# Patient Record
Sex: Female | Born: 1970 | Race: Black or African American | Hispanic: No | Marital: Married | Smoking: Never smoker
Health system: Southern US, Community
[De-identification: ages and names within clinical notes are randomized; demographics above are authoritative.]

## PROBLEM LIST (undated history)

## (undated) DIAGNOSIS — I10 Essential (primary) hypertension: Secondary | ICD-10-CM

## (undated) HISTORY — PX: BREAST BIOPSY: SHX20

---

## 2009-04-23 HISTORY — PX: BREAST BIOPSY: SHX20

## 2015-04-24 HISTORY — PX: COLONOSCOPY: SHX174

## 2017-06-16 ENCOUNTER — Emergency Department (HOSPITAL_COMMUNITY)
Admission: EM | Admit: 2017-06-16 | Discharge: 2017-06-16 | Disposition: A | Discharge status: Home / Self Care | Payer: 59 | Attending: Emergency Medicine | Admitting: Emergency Medicine

## 2017-06-16 ENCOUNTER — Encounter (HOSPITAL_COMMUNITY): Payer: Self-pay

## 2017-06-16 ENCOUNTER — Emergency Department (HOSPITAL_COMMUNITY): Payer: 59

## 2017-06-16 DIAGNOSIS — J111 Influenza due to unidentified influenza virus with other respiratory manifestations: Secondary | ICD-10-CM | POA: Diagnosis not present

## 2017-06-16 DIAGNOSIS — I10 Essential (primary) hypertension: Secondary | ICD-10-CM | POA: Diagnosis not present

## 2017-06-16 DIAGNOSIS — R05 Cough: Secondary | ICD-10-CM | POA: Diagnosis present

## 2017-06-16 HISTORY — DX: Essential (primary) hypertension: I10

## 2017-06-16 LAB — CBC
HCT: 34.9 % — ABNORMAL LOW (ref 36.0–46.0)
HEMOGLOBIN: 11.4 g/dL — AB (ref 12.0–15.0)
MCH: 27.4 pg (ref 26.0–34.0)
MCHC: 32.7 g/dL (ref 30.0–36.0)
MCV: 83.9 fL (ref 78.0–100.0)
PLATELETS: 391 10*3/uL (ref 150–400)
RBC: 4.16 MIL/uL (ref 3.87–5.11)
RDW: 15.5 % (ref 11.5–15.5)
WBC: 10.4 10*3/uL (ref 4.0–10.5)

## 2017-06-16 LAB — I-STAT CG4 LACTIC ACID, ED: LACTIC ACID, VENOUS: 1.48 mmol/L (ref 0.5–1.9)

## 2017-06-16 LAB — BASIC METABOLIC PANEL
Anion gap: 12 (ref 5–15)
BUN: 13 mg/dL (ref 6–20)
CHLORIDE: 103 mmol/L (ref 101–111)
CO2: 22 mmol/L (ref 22–32)
Calcium: 8.7 mg/dL — ABNORMAL LOW (ref 8.9–10.3)
Creatinine, Ser: 0.94 mg/dL (ref 0.44–1.00)
Glucose, Bld: 106 mg/dL — ABNORMAL HIGH (ref 65–99)
POTASSIUM: 3.1 mmol/L — AB (ref 3.5–5.1)
SODIUM: 137 mmol/L (ref 135–145)

## 2017-06-16 IMAGING — CR DG CHEST 2V
2 series · 2 of 2 positions shown · non-contrast
Comparison: None.

CLINICAL DATA: Shortness of Breath

EXAM:
CHEST  2 VIEW

[chest pa]
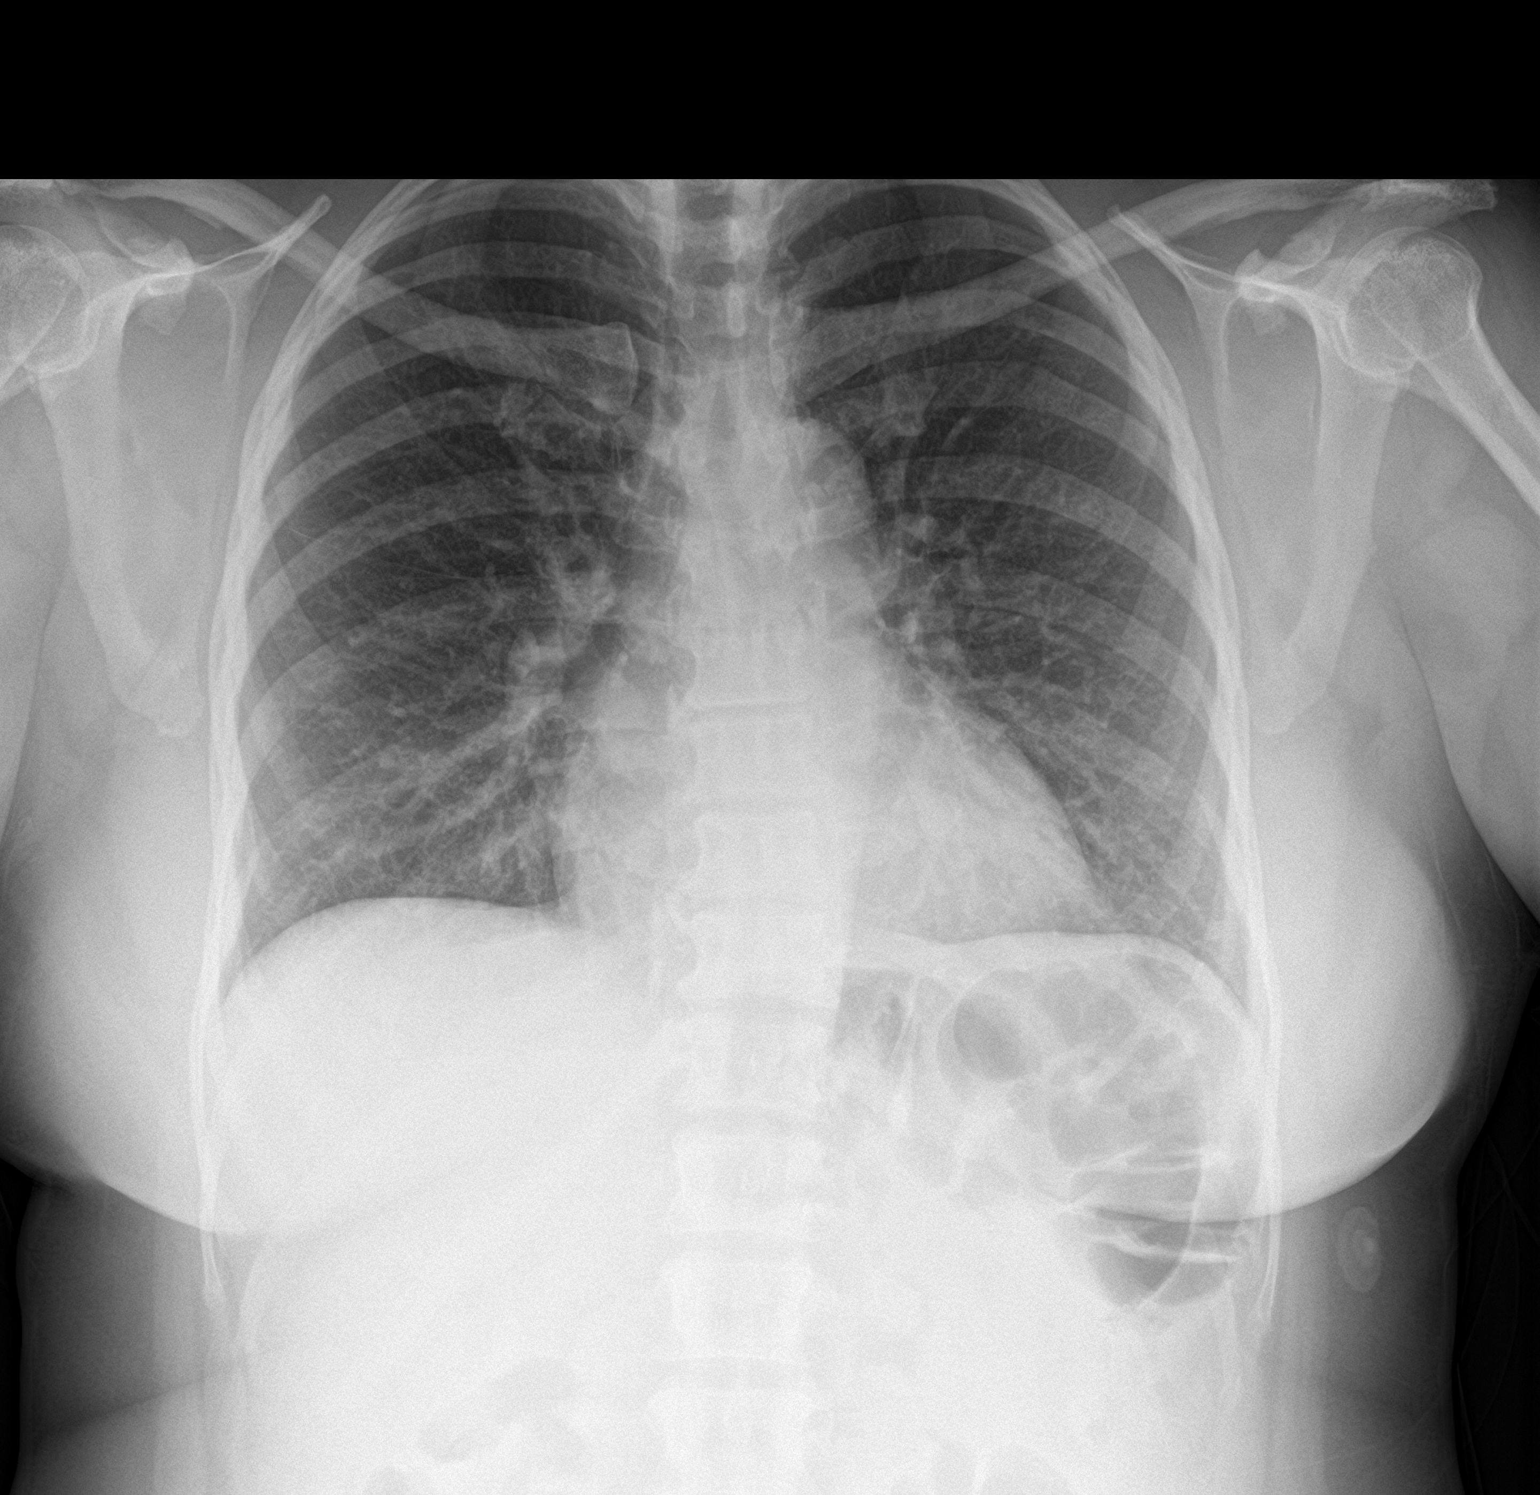

[chest lat]
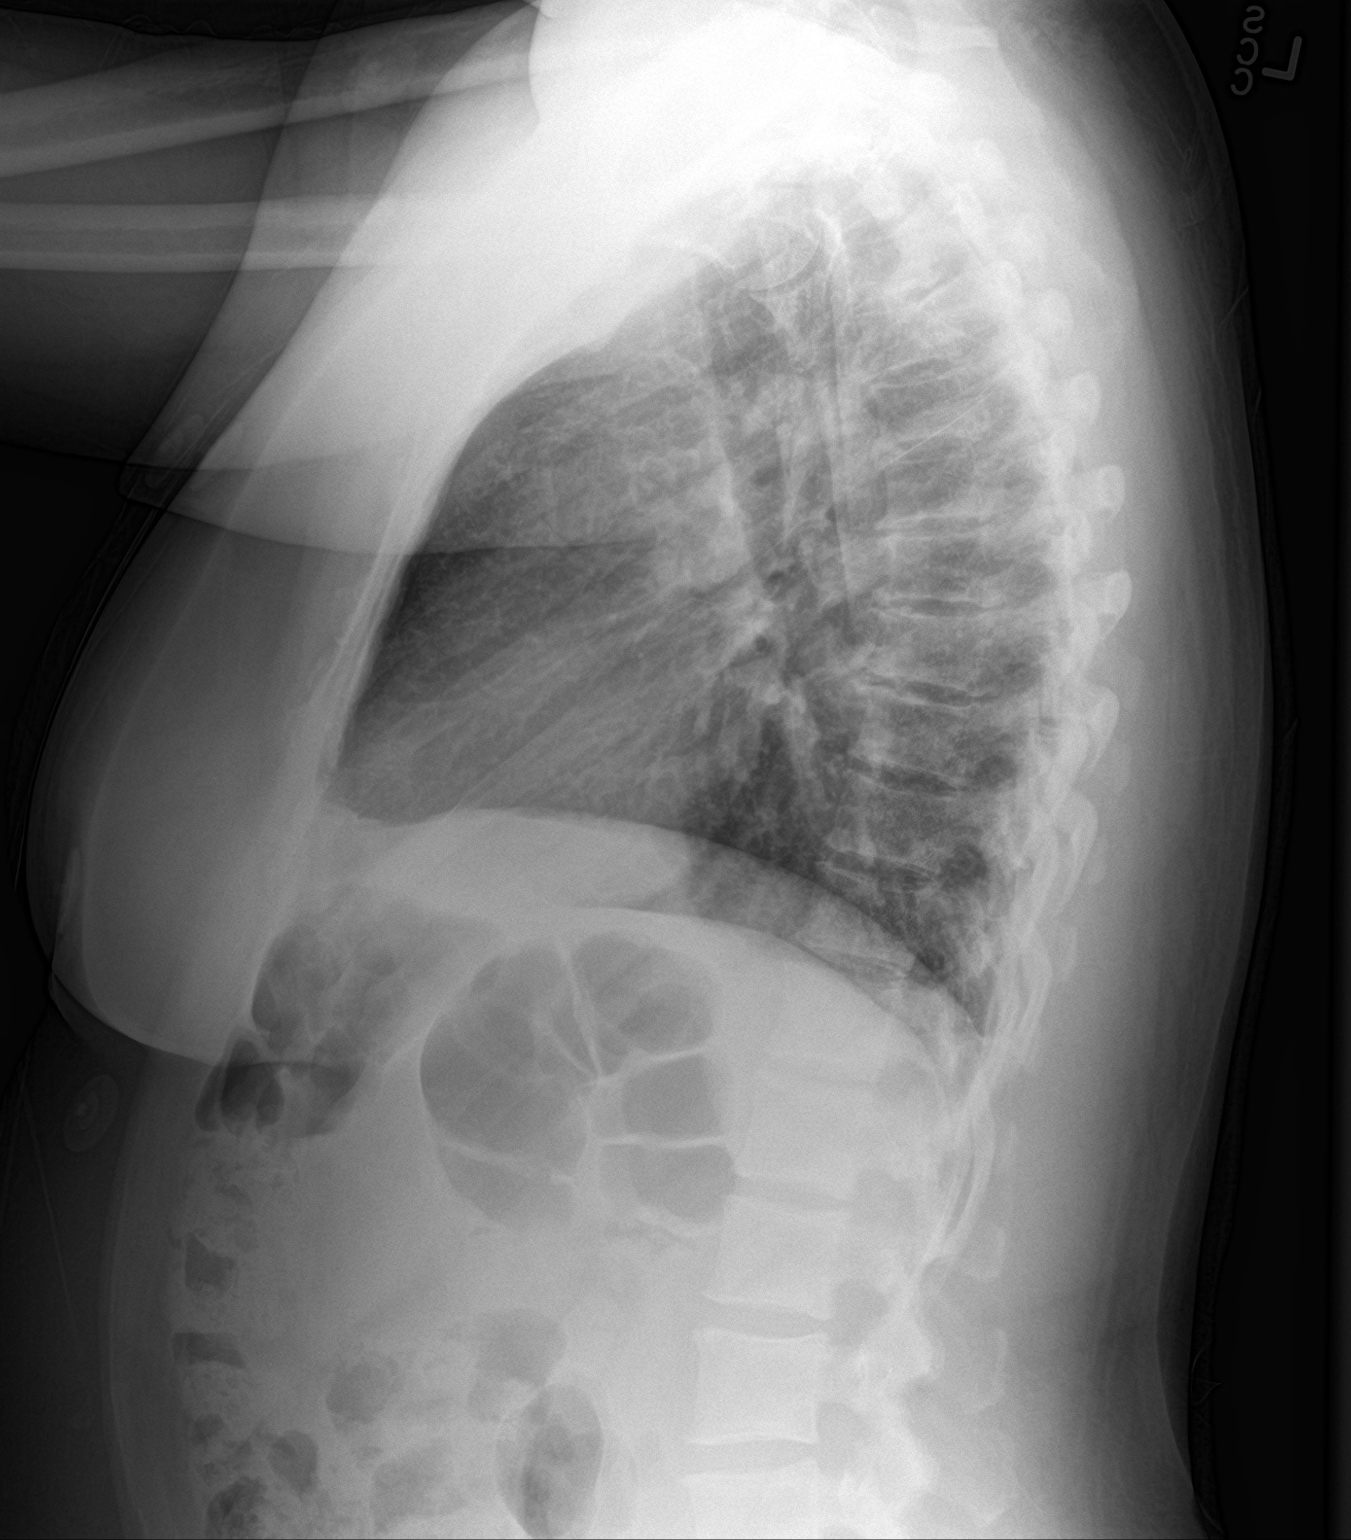

[2 of 2 positions shown; findings below may reference images not displayed]

FINDINGS: Mild peribronchial thickening. Heart and mediastinal contours are
within normal limits. No focal opacities or effusions. No acute bony
abnormality.
IMPRESSION: Mild bronchitic changes.

## 2017-06-16 MED ORDER — SODIUM CHLORIDE 0.9 % IV BOLUS (SEPSIS)
1000.0000 mL | Freq: Once | INTRAVENOUS | Status: AC
  Administered 2017-06-16: 1000 mL via INTRAVENOUS

## 2017-06-16 MED ORDER — OSELTAMIVIR PHOSPHATE 75 MG PO CAPS: 75.0000 mg | ORAL_CAPSULE | Freq: Two times a day (BID) | ORAL | 0 refills | Status: DC

## 2017-06-16 MED ORDER — IBUPROFEN 400 MG PO TABS
600.0000 mg | ORAL_TABLET | Freq: Once | ORAL | Status: AC
  Administered 2017-06-16: 600 mg via ORAL
  Filled 2017-06-16: qty 1

## 2017-06-16 MED ORDER — IBUPROFEN 600 MG PO TABS: 600.0000 mg | ORAL_TABLET | Freq: Four times a day (QID) | ORAL | 0 refills | Status: DC | PRN

## 2017-06-16 MED ORDER — OSELTAMIVIR PHOSPHATE 75 MG PO CAPS
75.0000 mg | ORAL_CAPSULE | Freq: Two times a day (BID) | ORAL | Status: DC
  Administered 2017-06-16: 75 mg via ORAL
  Filled 2017-06-16: qty 1

## 2017-06-16 NOTE — ED Notes (Addendum)
D/c reviewed with patient and spouse. Encouraged to complete all of the TAMIFLU ordered

## 2017-06-16 NOTE — ED Provider Notes (Signed)
[facility] Provider Note   CSN: [id number] Arrival date & time: 06/16/17  1926     History   Chief Complaint Chief Complaint  Patient presents with  . Shortness of Breath    HPI Mckenzie Jennings is a 47 y.o. female.  HPI Patient states she started having trouble with cough and congestion yesterday.  She has been bringing up yellow sputum.  She started having some shortness of breath last night as well as body aches.  She went to an urgent care and was diagnosed with influenza.  The doctor was  also concerned about the possibility of pneumonia so he gave her a shot of Rocephin and sent her to the ED.  Patient denies any vomiting or diarrhea.  No neck pain or stiffness. Past Medical History:  Diagnosis Date  . Hypertension     There are no active problems to display for this patient.   Past Surgical History:  Procedure Laterality Date  . BREAST BIOPSY      OB History    No data available       Home Medications    Prior to Admission medications   Medication Sig Start Date End Date Taking? Authorizing Provider  ibuprofen (ADVIL,MOTRIN) 600 MG tablet Take 1 tablet (600 mg total) by mouth every 6 (six) hours as needed. 06/16/17   Dorie Rank, MD  oseltamivir (TAMIFLU) 75 MG capsule Take 1 capsule (75 mg total) by mouth 2 (two) times daily. 06/16/17   Dorie Rank, MD    Family History No family history on file.  Social History Social History   Tobacco Use  . Smoking status: Never Smoker  . Smokeless tobacco: Never Used  Substance Use Topics  . Alcohol use: Yes    Alcohol/week: 0.6 oz    Types: 1 Glasses of wine per week  . Drug use: No     Allergies   Patient has no known allergies.   Review of Systems Review of Systems  All other systems reviewed and are negative.    Physical Exam Updated Vital Signs BP 132/72   Pulse (!) 119   Temp (!) 100.9 F (38.3 C) (Oral)   Resp 20   Ht 1.651 m (5\' 5" )   Wt 83.9 kg  (185 lb)   SpO2 99%   BMI 30.79 kg/m   Physical Exam  Constitutional: She appears well-developed and well-nourished. No distress.  HENT:  Head: Normocephalic and atraumatic.  Right Ear: External ear normal.  Left Ear: External ear normal.  Eyes: Conjunctivae are normal. Right eye exhibits no discharge. Left eye exhibits no discharge. No scleral icterus.  Neck: Neck supple. No tracheal deviation present.  Cardiovascular: Regular rhythm and intact distal pulses. Tachycardia present.  Pulmonary/Chest: Effort normal and breath sounds normal. No stridor. No respiratory distress. She has no wheezes. She has no rales.  Abdominal: Soft. Bowel sounds are normal. She exhibits no distension. There is no tenderness. There is no rebound and no guarding.  Musculoskeletal: She exhibits no edema or tenderness.  Neurological: She is alert. She has normal strength. No cranial nerve deficit (no facial droop, extraocular movements intact, no slurred speech) or sensory deficit. She exhibits normal muscle tone. She displays no seizure activity. Coordination normal.  Skin: Skin is warm and dry. No rash noted.  Psychiatric: She has a normal mood and affect.  Nursing note and vitals reviewed.    ED Treatments / Results  Labs (all labs ordered are listed, but only  abnormal results are displayed) Labs Reviewed  CBC - Abnormal; Notable for the following components:      Result Value   Hemoglobin 11.4 (*)    HCT 34.9 (*)    All other components within normal limits  BASIC METABOLIC PANEL - Abnormal; Notable for the following components:   Potassium 3.1 (*)    Glucose, Bld 106 (*)    Calcium 8.7 (*)    All other components within normal limits  I-STAT CG4 LACTIC ACID, ED    EKG  EKG Interpretation  Date/Time:  "Sunday June 16 2017 19:35:53 EST Ventricular Rate:  124 PR Interval:  142 QRS Duration: 88 QT Interval:  322 QTC Calculation: 462 R Axis:   72 Text Interpretation:  Sinus tachycardia  Right atrial enlargement Possible Inferior infarct , age undetermined Abnormal ECG No previous tracing Confirmed by Onofrio Klemp ([id number]) on 06/16/2017 8:48:59 PM       Radiology Dg Chest 2 View  Result Date: 06/16/2017 CLINICAL DATA:  Shortness of Breath EXAM: CHEST  2 VIEW COMPARISON:  None. FINDINGS: Mild peribronchial thickening. Heart and mediastinal contours are within normal limits. No focal opacities or effusions. No acute bony abnormality. IMPRESSION: Mild bronchitic changes. Electronically Signed   By: Kevin  Dover M.D.   On: 06/16/2017 20:05    Procedures Procedures (including critical care time)  Medications Ordered in ED Medications  sodium chloride 0.9 % bolus 1,000 mL   oseltamivir (TAMIFLU) capsule 75 mg  ibuprofen (ADVIL,MOTRIN) tablet 600 mg     Initial Impression / Assessment and Plan / ED Course  I have reviewed the triage vital signs and the nursing notes.  Pertinent labs & imaging results that were available during my care of the patient were reviewed by me and considered in my medical decision making (see chart for details).   Patient's laboratory tests are reassuring.  Mild hypokalemia and anemia noted but I doubt this is clinically significant.  Lactic acid is normal.  Chest x-ray without pneumonia.  No signs of sepsis.  Patient is tachycardic but I suspect this is related to the influenza and her fever.  We will give the patient a bolus of IV fluids and a dose of Tamiflu.  Plan on discharge home with a prescription for Tamiflu.  Final Clinical Impressions(s) / ED Diagnoses   Final diagnoses:  Influenza    ED Discharge Orders        Ordered    oseltamivir (TAMIFLU) 75 MG capsule  2 times daily     06/16/17 2112    ibuprofen (ADVIL,MOTRIN) 600 MG tablet  Every 6 hours PRN     02" /24/19 2112       Dorie Rank, MD 06/16/17 2112

## 2017-06-16 NOTE — ED Triage Notes (Signed)
Pt comes from [facility] for flu and pneumonia. Some SOB since last night. Productive cough with yellow sputum

## 2017-06-16 NOTE — Discharge Instructions (Signed)
Follow-up with your primary care doctor if you are not improving in the next week, drink plenty of fluids and rest, take ibuprofen or Tylenol as needed for aches, pains and fevers

## 2017-12-02 ENCOUNTER — Other Ambulatory Visit: Payer: Self-pay | Admitting: Family Medicine

## 2017-12-02 DIAGNOSIS — N6310 Unspecified lump in the right breast, unspecified quadrant: Secondary | ICD-10-CM

## 2017-12-20 ENCOUNTER — Other Ambulatory Visit: Payer: 59

## 2017-12-24 ENCOUNTER — Ambulatory Visit
Admission: RE | Admit: 2017-12-24 | Discharge: 2017-12-24 | Disposition: A | Discharge status: Home / Self Care | Payer: Self-pay | Source: Ambulatory Visit | Attending: Family Medicine | Admitting: Family Medicine

## 2017-12-24 ENCOUNTER — Other Ambulatory Visit: Payer: Self-pay | Admitting: Family Medicine

## 2017-12-24 ENCOUNTER — Ambulatory Visit
Admission: RE | Admit: 2017-12-24 | Discharge: 2017-12-24 | Disposition: A | Discharge status: Home / Self Care | Payer: PRIVATE HEALTH INSURANCE | Source: Ambulatory Visit | Attending: Family Medicine | Admitting: Family Medicine

## 2017-12-24 DIAGNOSIS — N6310 Unspecified lump in the right breast, unspecified quadrant: Secondary | ICD-10-CM

## 2017-12-24 IMAGING — MG DIGITAL DIAGNOSTIC BILATERAL MAMMOGRAM WITH TOMO AND CAD
8 series · 8 of 24 positions shown · non-contrast
Comparison: Previous exam(s).

CLINICAL DATA: 47-year-old female with right breast pain which has
since resolved.

EXAM:
DIGITAL DIAGNOSTIC BILATERAL MAMMOGRAM WITH CAD AND TOMO
RIGHT BREAST ULTRASOUND

[R MLO synth-2D]
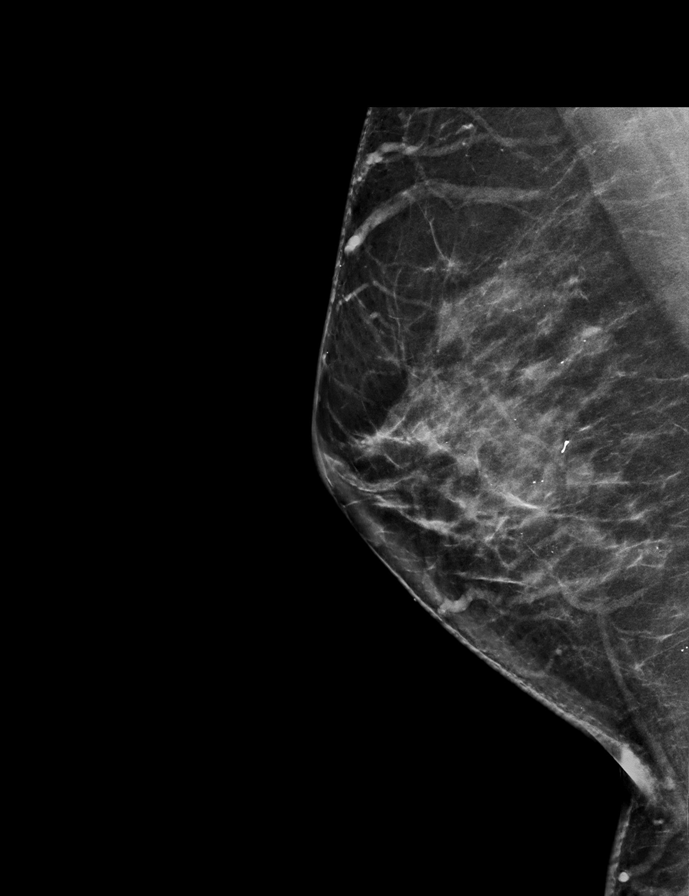

[L CC synth-2D]
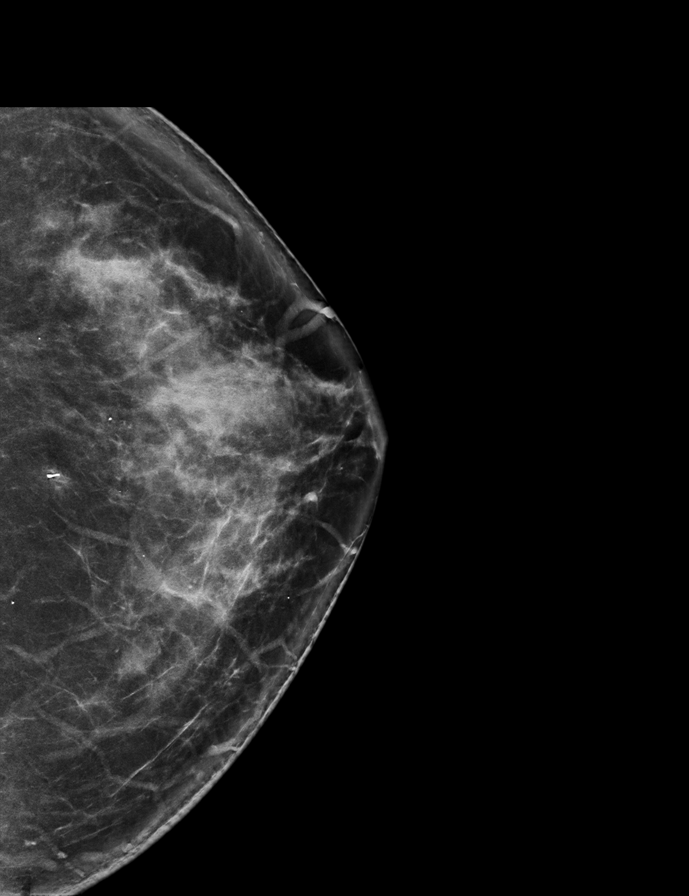

[L MLO synth-2D]
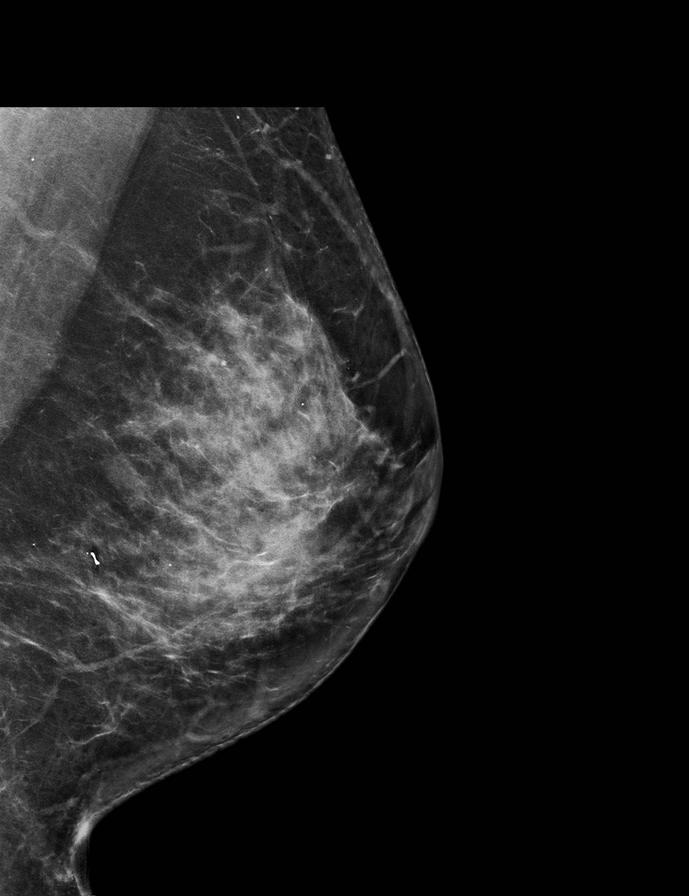

[R CC synth-2D]
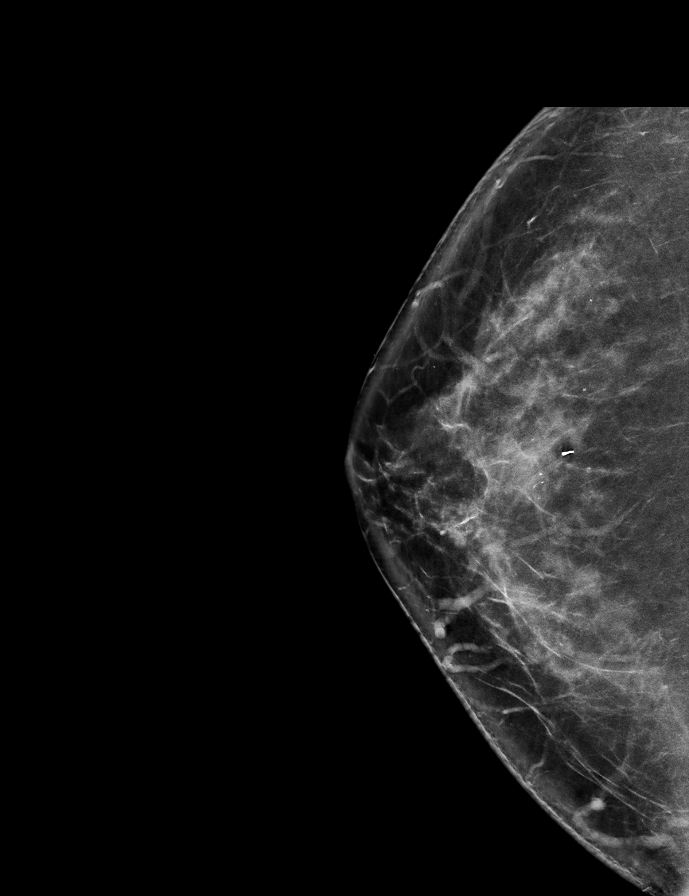

[L CC tomo · tomo slice 43/85.0]
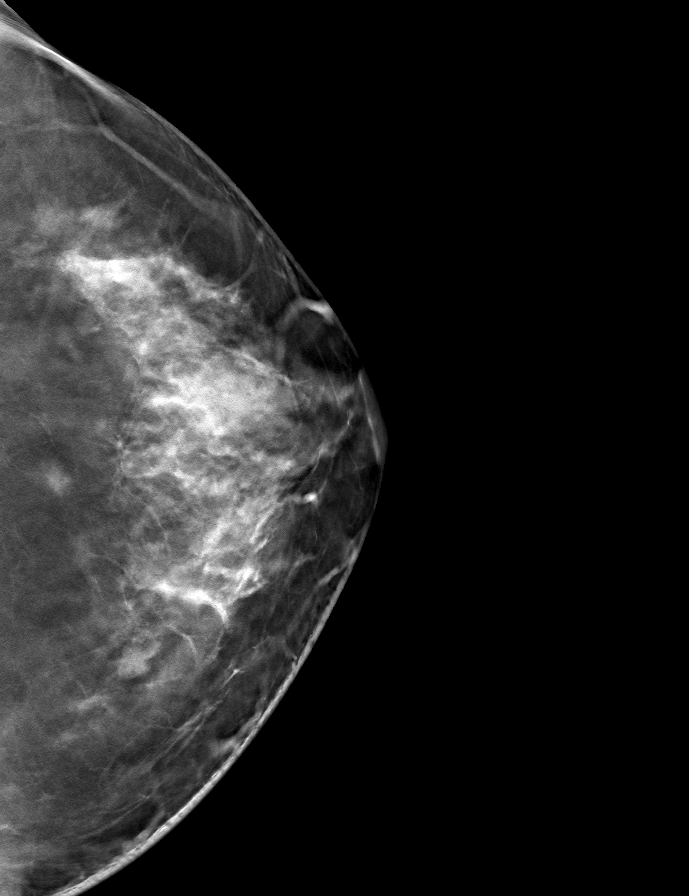

[R CC tomo · tomo slice 43/85.0]
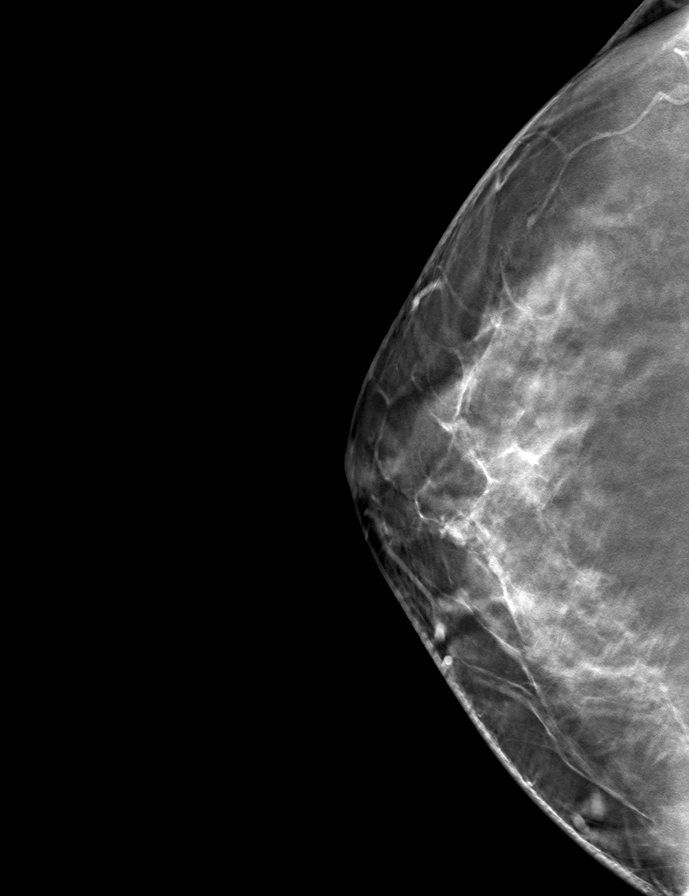

[R MLO tomo · tomo slice 41/82.0]
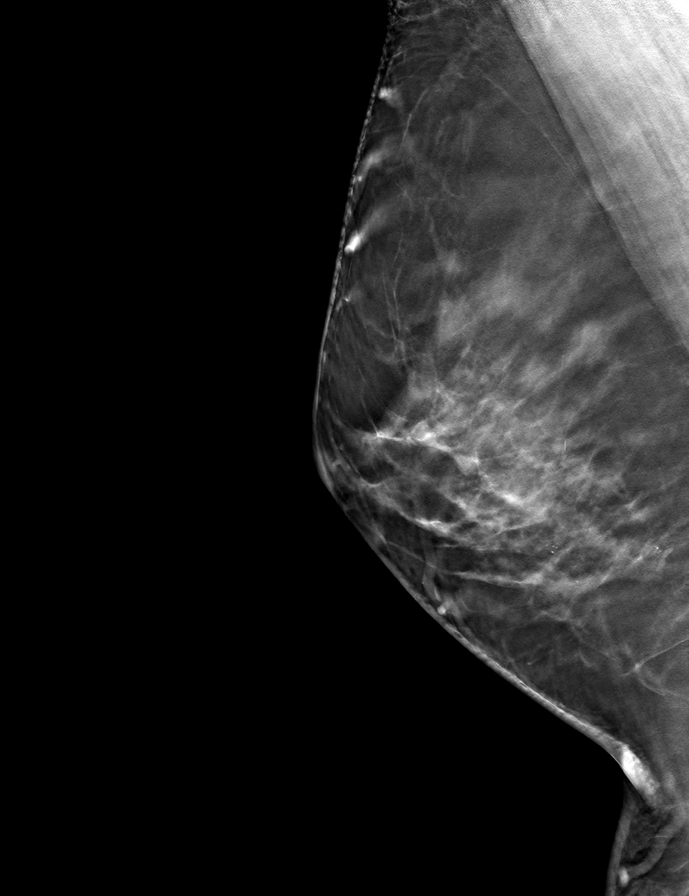

[L MLO tomo · tomo slice 43/86.0]
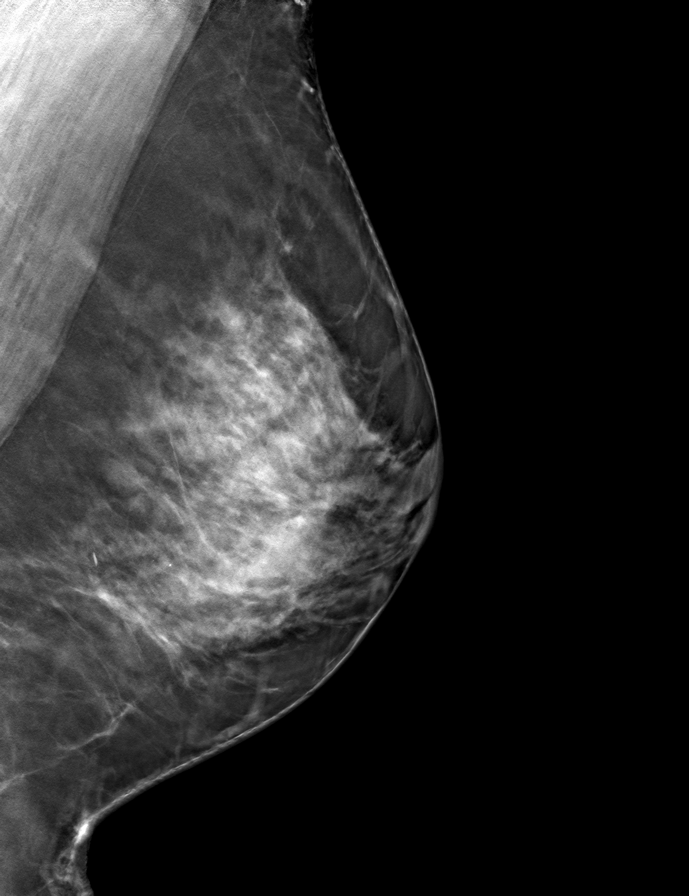

[8 of 24 positions shown; findings below may reference images not displayed]

ACR Breast Density Category c: The breast tissue is heterogeneously
dense, which may obscure small masses.
FINDINGS: There is an oval circumscribed mass in the upper slightly outer
right breast measuring 0.8 cm. No suspicious masses or abnormality
seen in the left breast. Several additional bilateral circumscribed
oval and round circumscribed masses are present suggestive of
fluctuating cysts or fibroadenomas.

Mammographic images were processed with CAD.

Physical examination the upper outer right breast does not reveal
any palpable masses.

Targeted ultrasound of the right breast was performed demonstrating
a cyst at the [DATE] position 4 cm from nipple measuring 0.6 x 0.3 x
0.6 cm. An additional probable complicated cyst or cluster of
microcysts at 12 o'clock 2 cm from nipple measures 0.5 x 0.2 x
cm. An additional probable cluster of cysts at the 2 o'clock
position 2 cm from nipple measures 0.6 x 0.4 x 0.9 cm. Additional
scattered smaller cysts are seen in the right breast. No suspicious
masses or abnormality seen.
IMPRESSION: Probably benign masses in the right breast at the 12 o'clock
position and 2 o'clock position.

RECOMMENDATION:
Diagnostic mammography of the right breast in 6 months with right
breast ultrasound.

I have discussed the findings and recommendations with the patient.
Results were also provided in writing at the conclusion of the
visit. If applicable, a reminder letter will be sent to the patient
regarding the next appointment.

BI-RADS CATEGORY  3: Probably benign.

## 2018-06-25 ENCOUNTER — Other Ambulatory Visit: Payer: Self-pay | Admitting: Family Medicine

## 2018-06-25 ENCOUNTER — Ambulatory Visit
Admission: RE | Admit: 2018-06-25 | Discharge: 2018-06-25 | Disposition: A | Discharge status: Home / Self Care | Payer: PRIVATE HEALTH INSURANCE | Source: Ambulatory Visit | Attending: Family Medicine | Admitting: Family Medicine

## 2018-06-25 DIAGNOSIS — N6310 Unspecified lump in the right breast, unspecified quadrant: Secondary | ICD-10-CM

## 2018-06-25 IMAGING — MG DIGITAL DIAGNOSTIC UNILATERAL RIGHT MAMMOGRAM WITH TOMO AND CAD
4 series · 4 of 12 positions shown · non-contrast
Comparison: Previous exam(s).

CLINICAL DATA: Short-term follow-up for 2 probably benign masses in
the right breast, initially evaluated in [DATE].

EXAM:
DIGITAL DIAGNOSTIC RIGHT MAMMOGRAM WITH CAD AND TOMO
ULTRASOUND RIGHT BREAST

[R CC synth-2D]
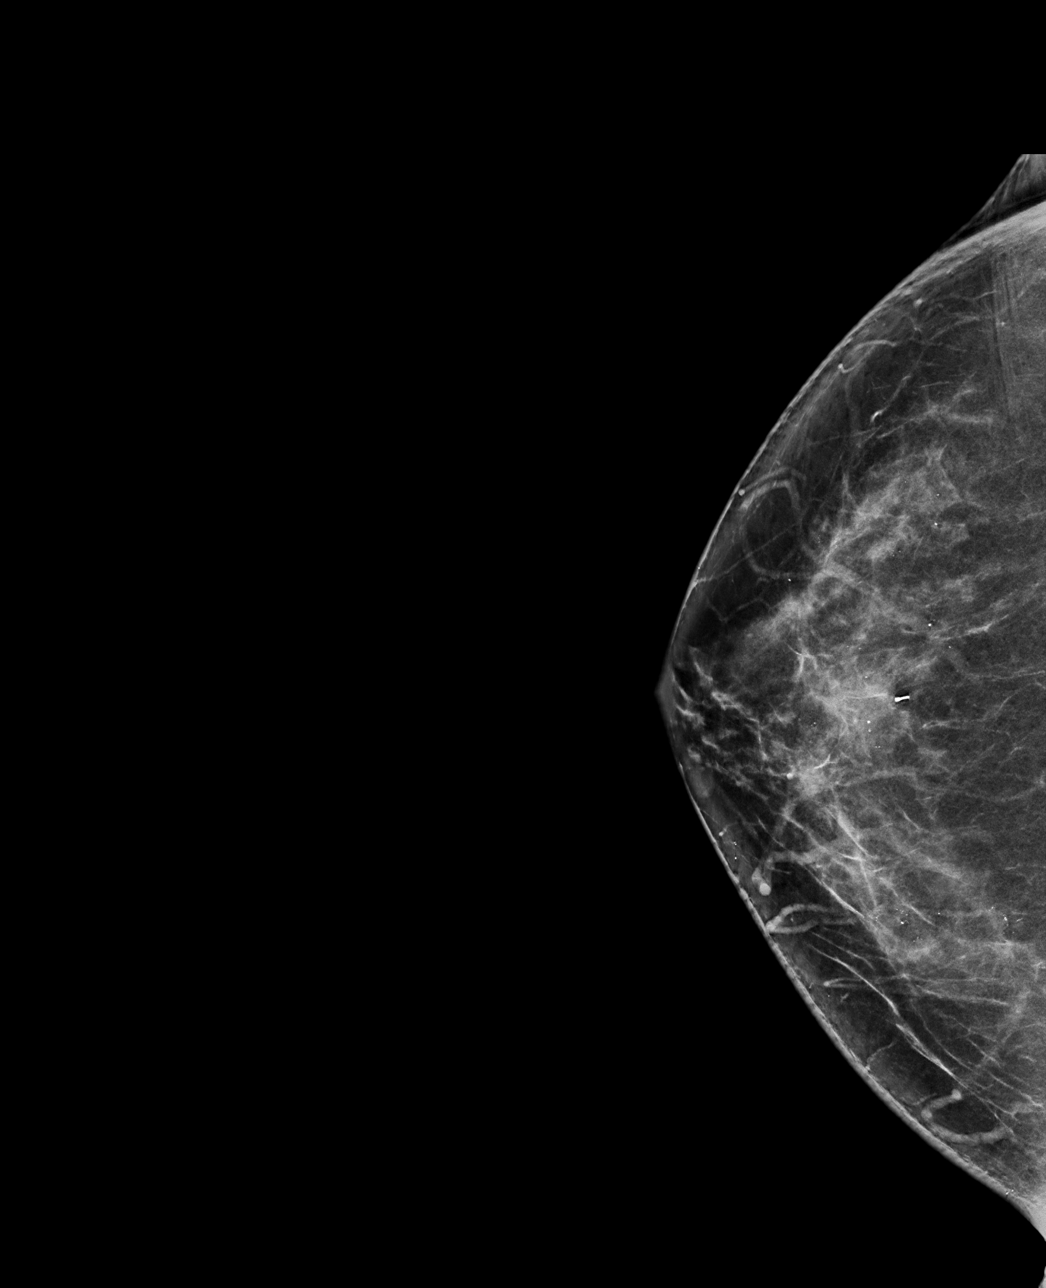

[R MLO synth-2D]
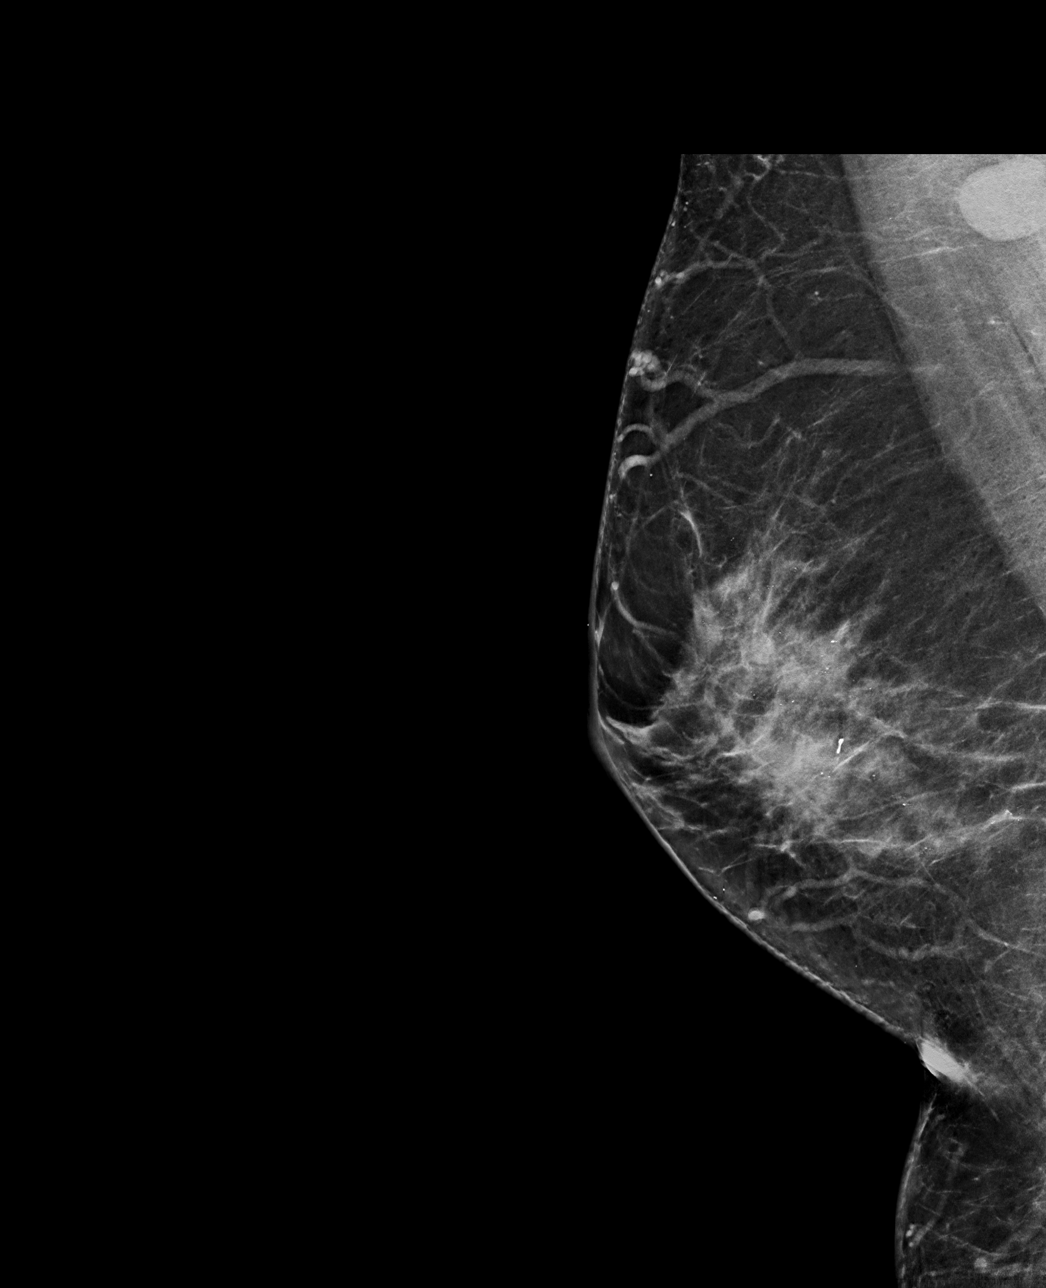

[R MLO tomo · tomo slice 43/84.0]
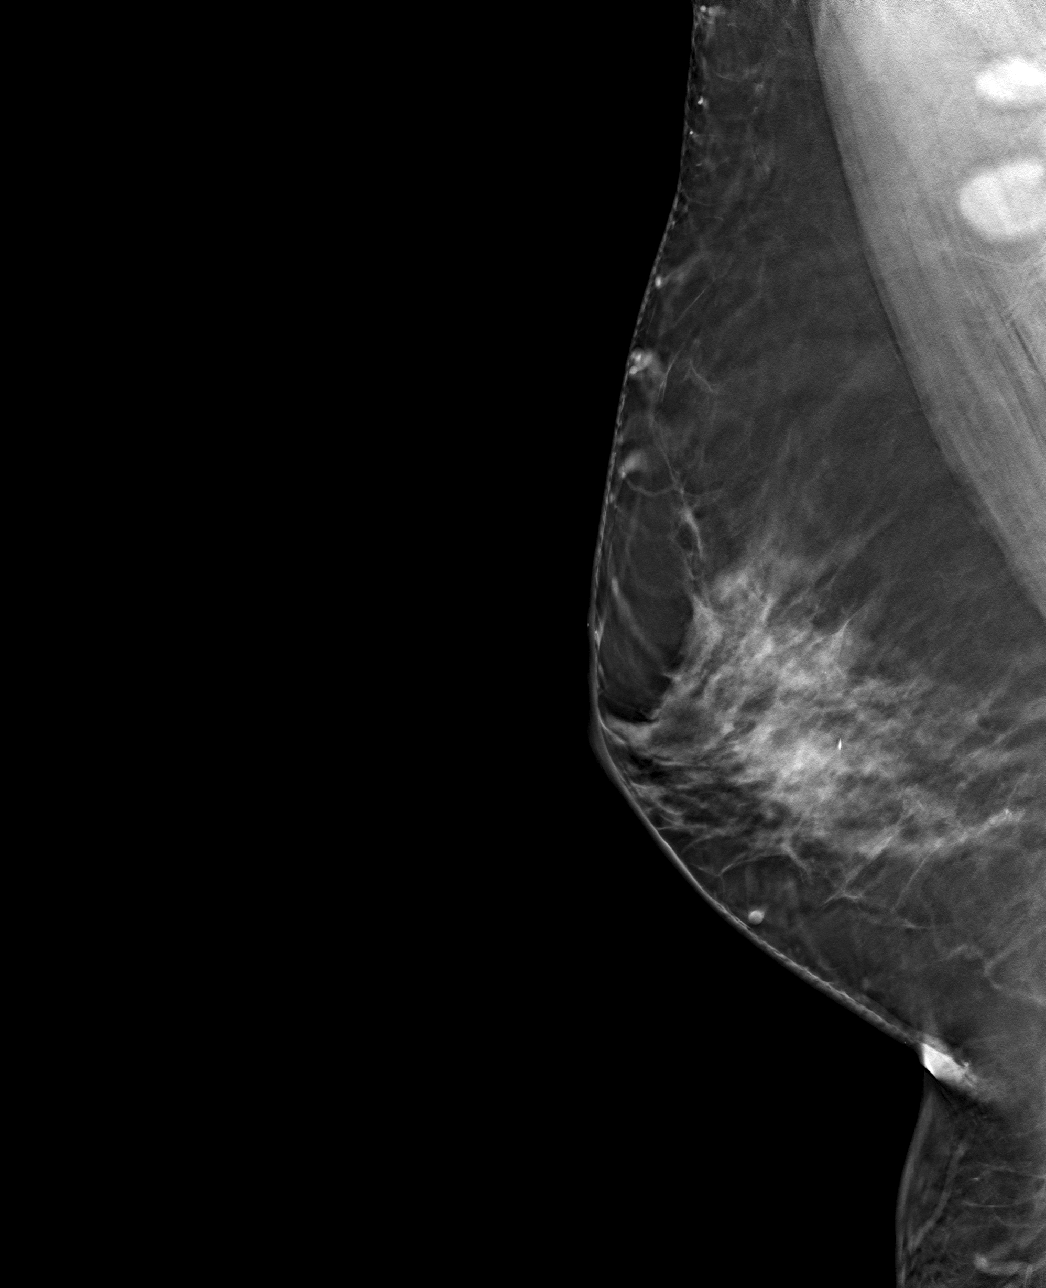

[R CC tomo · tomo slice 41/81.0]
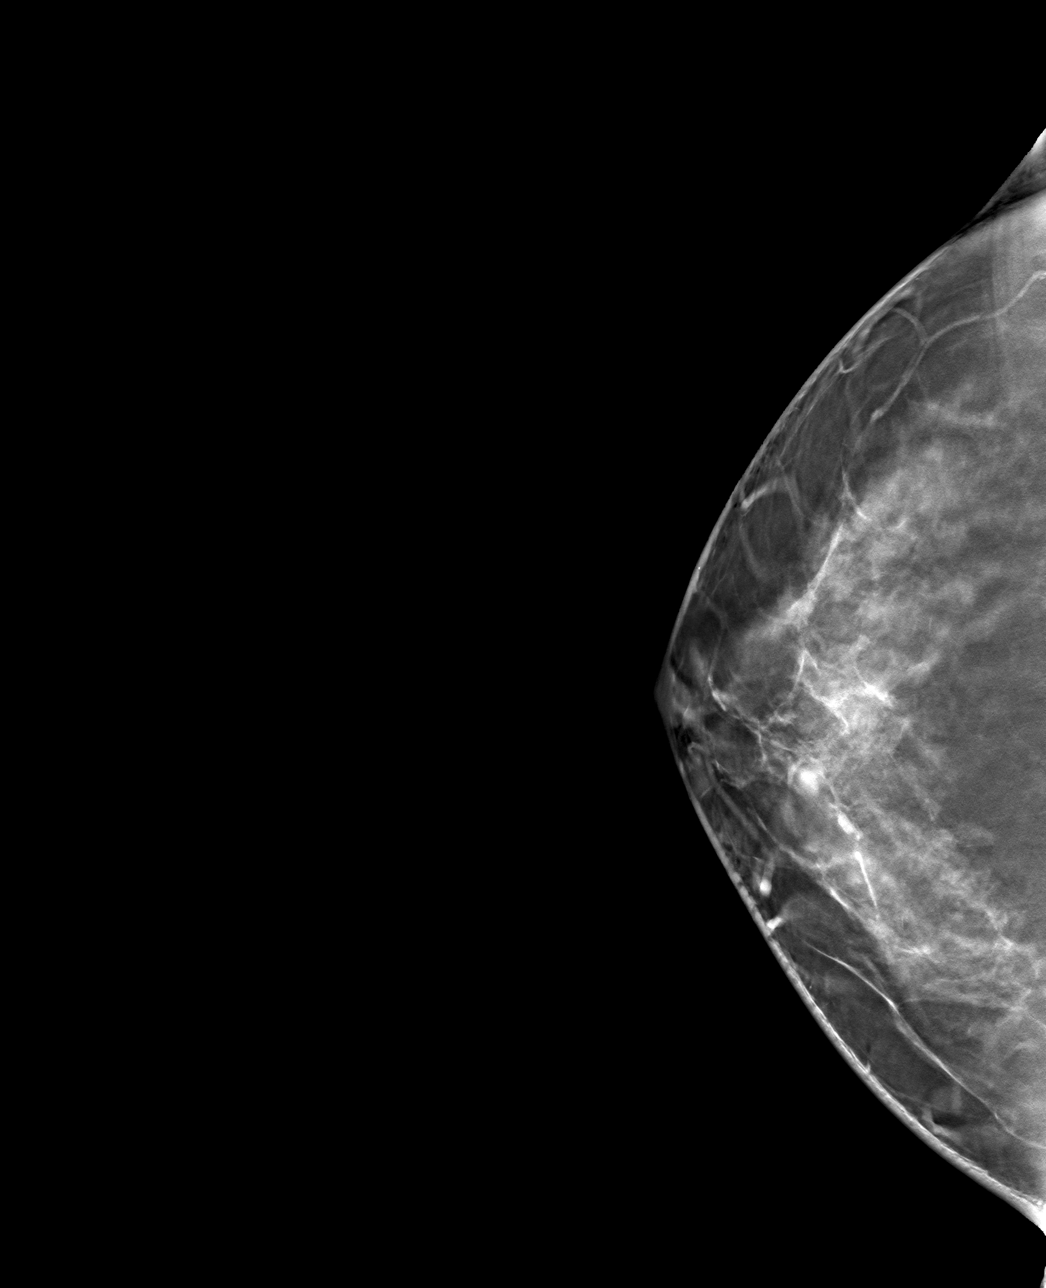

[4 of 12 positions shown; findings below may reference images not displayed]

ACR Breast Density Category c: The breast tissue is heterogeneously
dense, which may obscure small masses.
FINDINGS: The subtle mass noted in the upper outer right breast, along the
posterior margin of the fibroglandular tissue, is without
significant change. There are no new masses, no areas of
architectural distortion and no new or suspicious calcifications.

Mammographic images were processed with CAD.

Targeted ultrasound is performed, showing oval mass in the right
breast at 2 o'clock, 2 cm the nipple, measuring 9 x 4 x 5 mm. It
contains a thin septation in a few internal echoes. At 12 o'clock, 2
cm the nipple, there is a smaller hypo to anechoic mass with a few
internal echoes measuring 6 x 3 x 5 mm. These masses, consistent
with complicated cysts, are unchanged from prior exam.
IMPRESSION: 1. Probably benign masses in the right breast, most likely
complicated cysts, stable for 6 months. Additional short-term
follow-up recommended.
2. No new abnormalities.

RECOMMENDATION:
Diagnostic mammography and right breast ultrasound in 6 months.

I have discussed the findings and recommendations with the patient.
Results were also provided in writing at the conclusion of the
visit. If applicable, a reminder letter will be sent to the patient
regarding the next appointment.

BI-RADS CATEGORY  3: Probably benign.

## 2018-06-25 IMAGING — US ULTRASOUND RIGHT BREAST LIMITED
1 series · 8 of 8 positions shown · non-contrast
Comparison: Previous exam(s).

CLINICAL DATA: Short-term follow-up for 2 probably benign masses in
the right breast, initially evaluated in [DATE].

EXAM:
DIGITAL DIAGNOSTIC RIGHT MAMMOGRAM WITH CAD AND TOMO
ULTRASOUND RIGHT BREAST

[Series 1: ultrasound right breast limited · 0.06mm/px · 8 of 8 slices shown]
[im 1/8]
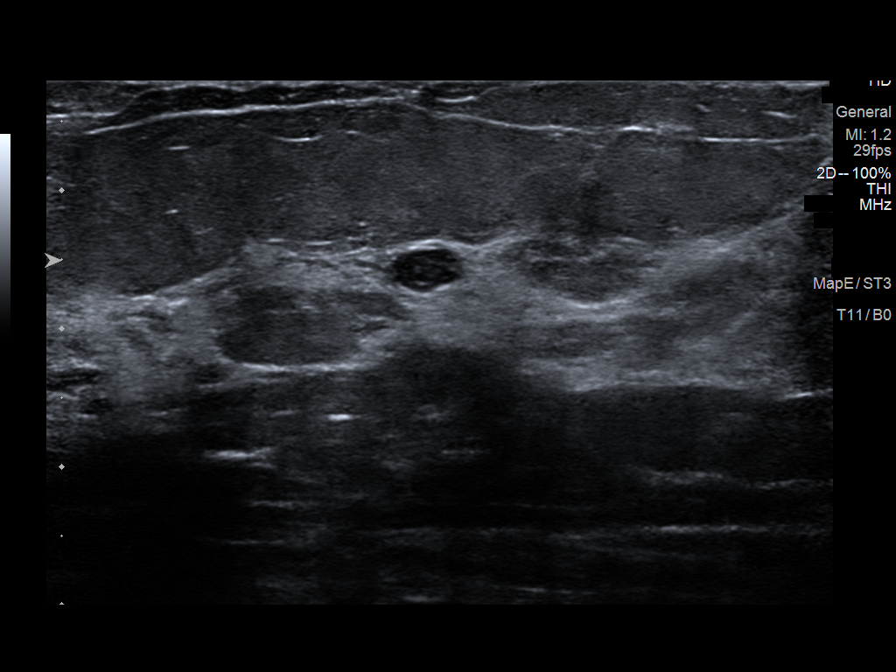
[im 2/8]
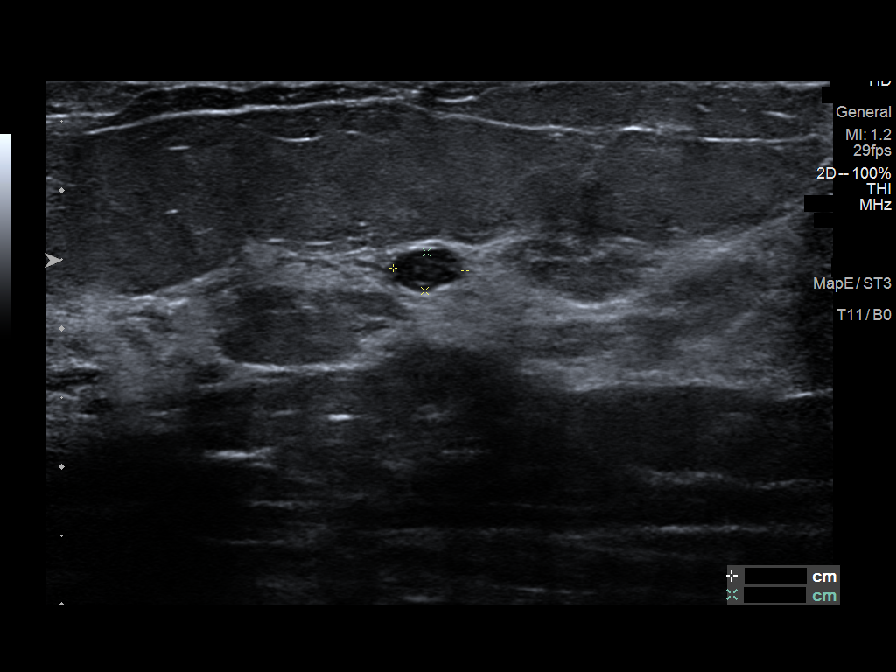
[im 3/8]
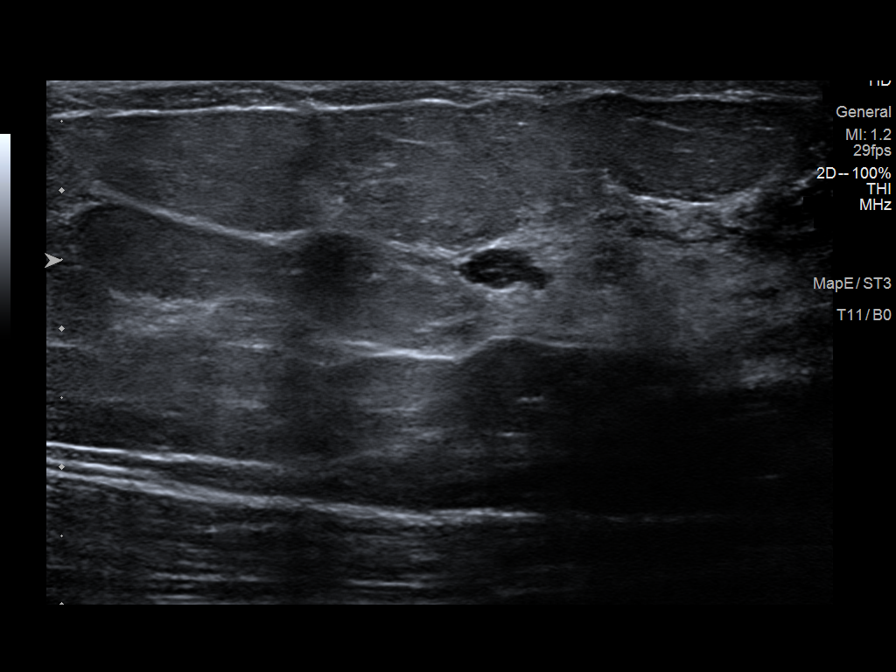
[im 4/8]
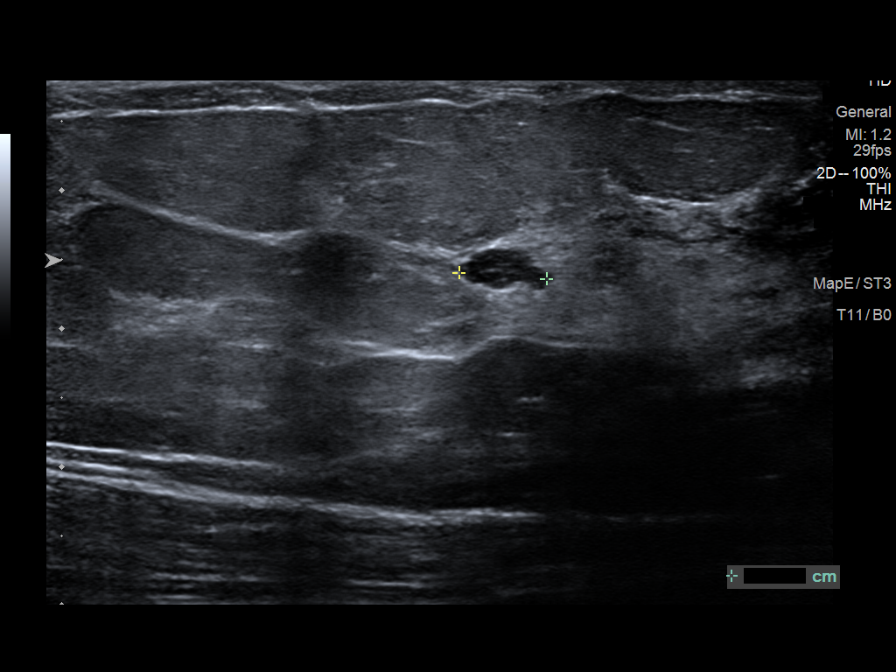
[im 5/8]
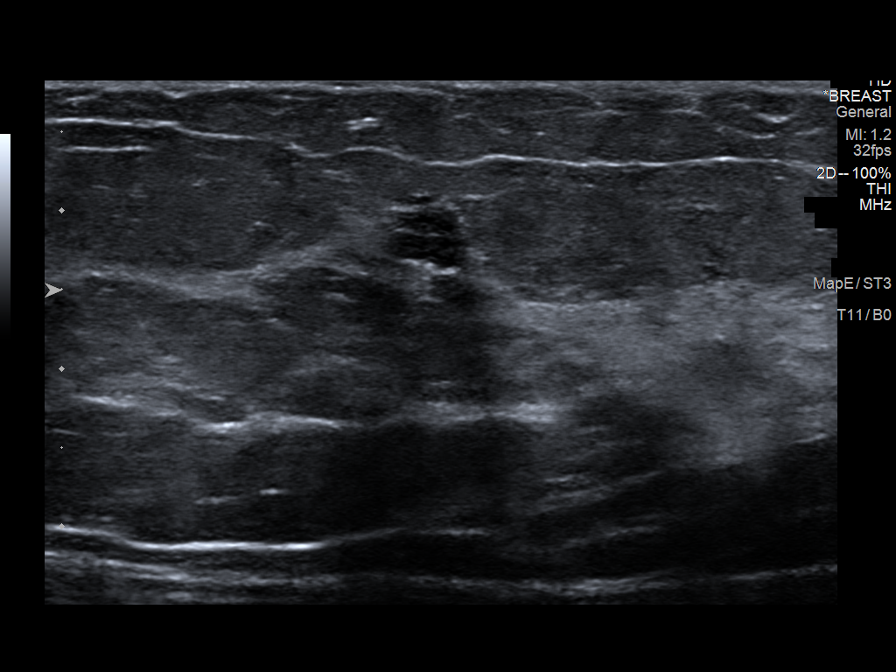
[im 6/8]
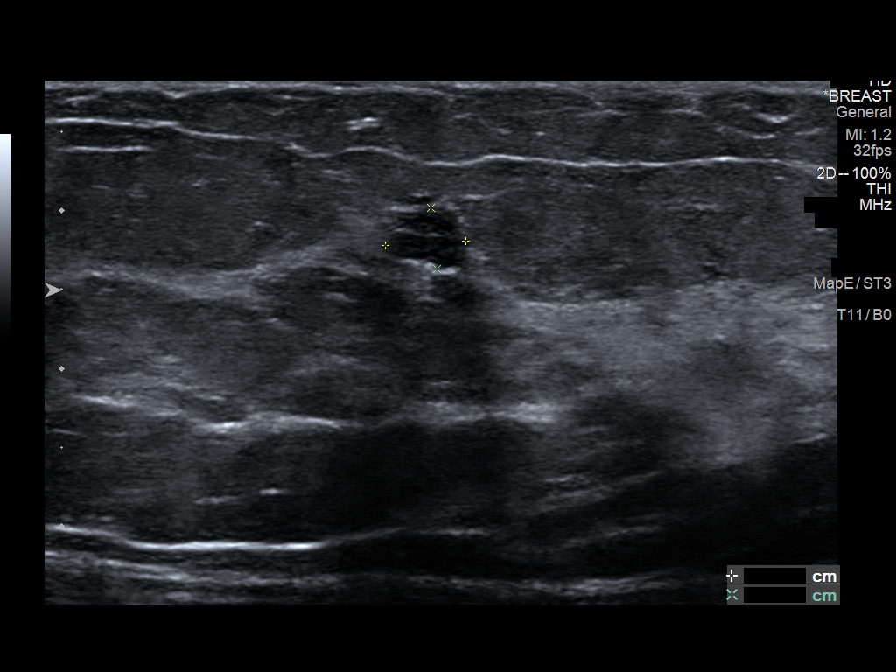
[im 7/8]
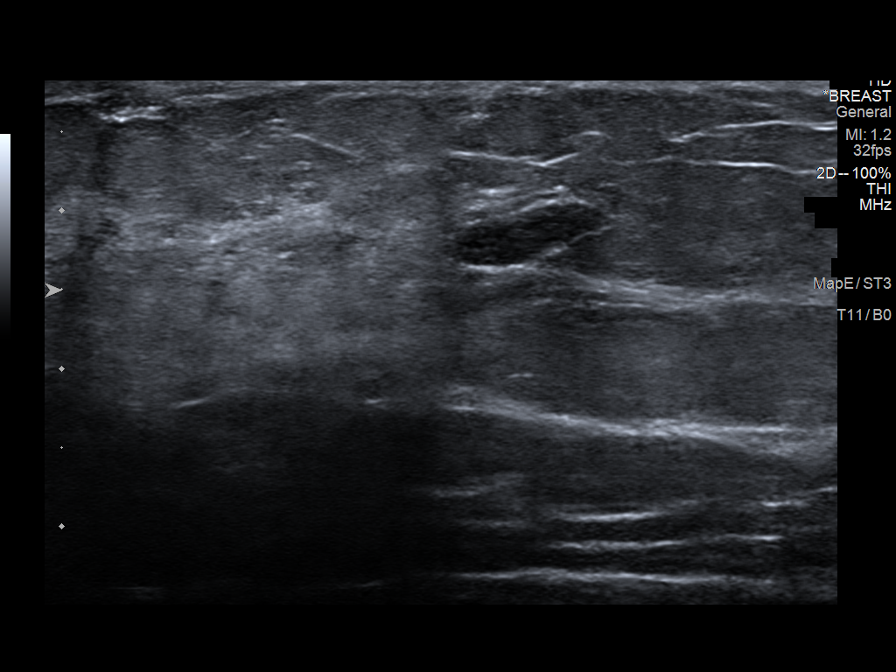
[im 8/8]
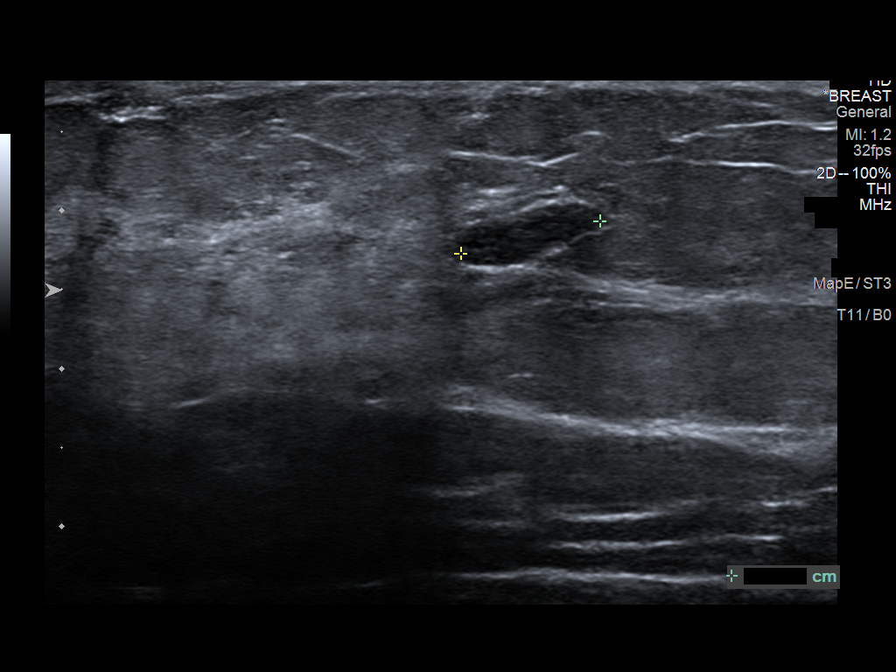

[8 of 8 positions shown; findings below may reference images not displayed]

ACR Breast Density Category c: The breast tissue is heterogeneously
dense, which may obscure small masses.
FINDINGS: The subtle mass noted in the upper outer right breast, along the
posterior margin of the fibroglandular tissue, is without
significant change. There are no new masses, no areas of
architectural distortion and no new or suspicious calcifications.

Mammographic images were processed with CAD.

Targeted ultrasound is performed, showing oval mass in the right
breast at 2 o'clock, 2 cm the nipple, measuring 9 x 4 x 5 mm. It
contains a thin septation in a few internal echoes. At 12 o'clock, 2
cm the nipple, there is a smaller hypo to anechoic mass with a few
internal echoes measuring 6 x 3 x 5 mm. These masses, consistent
with complicated cysts, are unchanged from prior exam.
IMPRESSION: 1. Probably benign masses in the right breast, most likely
complicated cysts, stable for 6 months. Additional short-term
follow-up recommended.
2. No new abnormalities.

RECOMMENDATION:
Diagnostic mammography and right breast ultrasound in 6 months.

I have discussed the findings and recommendations with the patient.
Results were also provided in writing at the conclusion of the
visit. If applicable, a reminder letter will be sent to the patient
regarding the next appointment.

BI-RADS CATEGORY  3: Probably benign.

## 2018-12-26 ENCOUNTER — Ambulatory Visit
Admission: RE | Admit: 2018-12-26 | Discharge: 2018-12-26 | Disposition: A | Discharge status: Home / Self Care | Payer: PRIVATE HEALTH INSURANCE | Source: Ambulatory Visit | Attending: Family Medicine | Admitting: Family Medicine

## 2018-12-26 ENCOUNTER — Other Ambulatory Visit: Payer: Self-pay

## 2018-12-26 DIAGNOSIS — N6310 Unspecified lump in the right breast, unspecified quadrant: Secondary | ICD-10-CM

## 2018-12-26 IMAGING — MG MM DIGITAL DIAGNOSTIC BILAT W/ TOMO W/ CAD
8 series · 8 of 24 positions shown · non-contrast
Comparison: Previous exam(s).

CLINICAL DATA: Here for 1 year follow-up of probably benign masses
in the right breast.

EXAM:
DIGITAL DIAGNOSTIC BILATERAL MAMMOGRAM WITH CAD AND TOMO
ULTRASOUND RIGHT BREAST

[L MLO synth-2D]
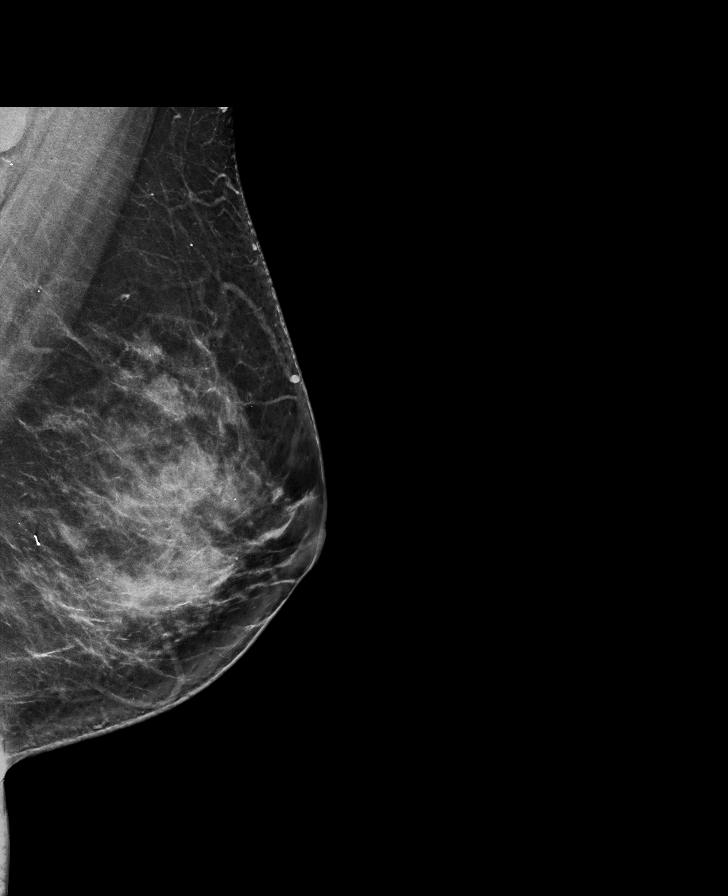

[L CC synth-2D]
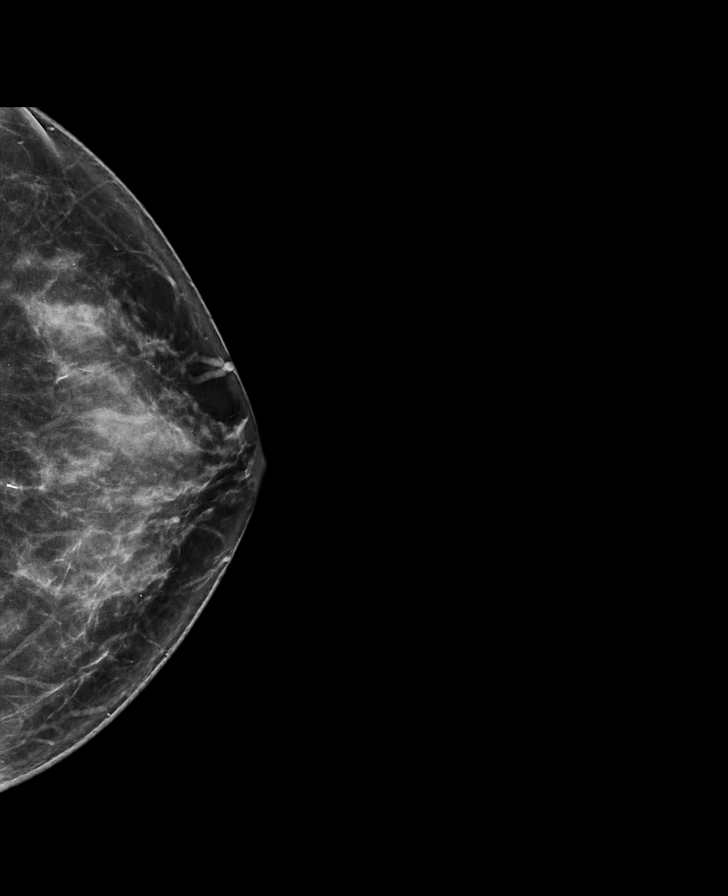

[R CC synth-2D]
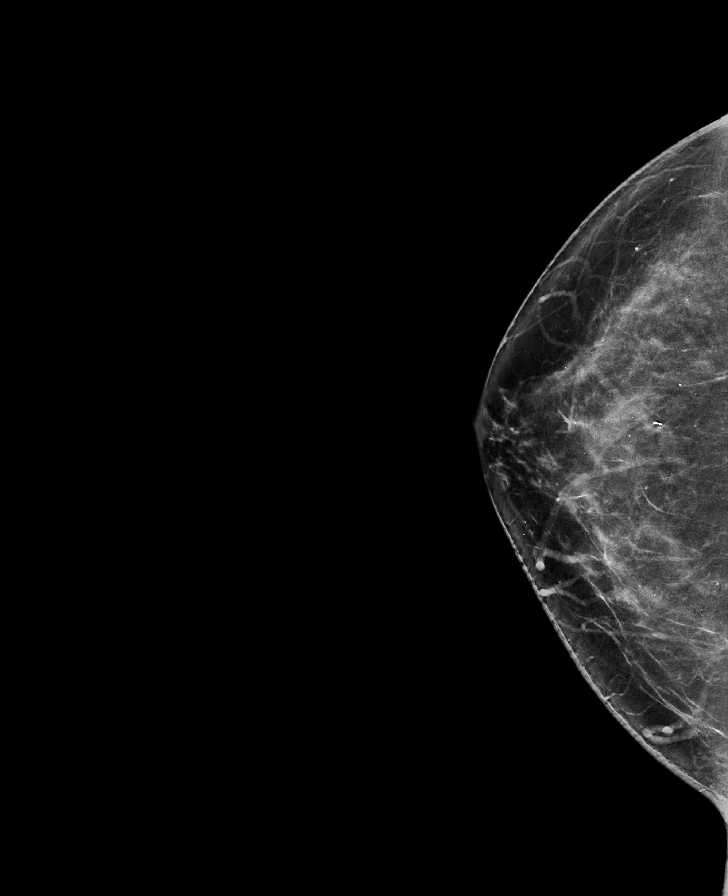

[R MLO synth-2D]
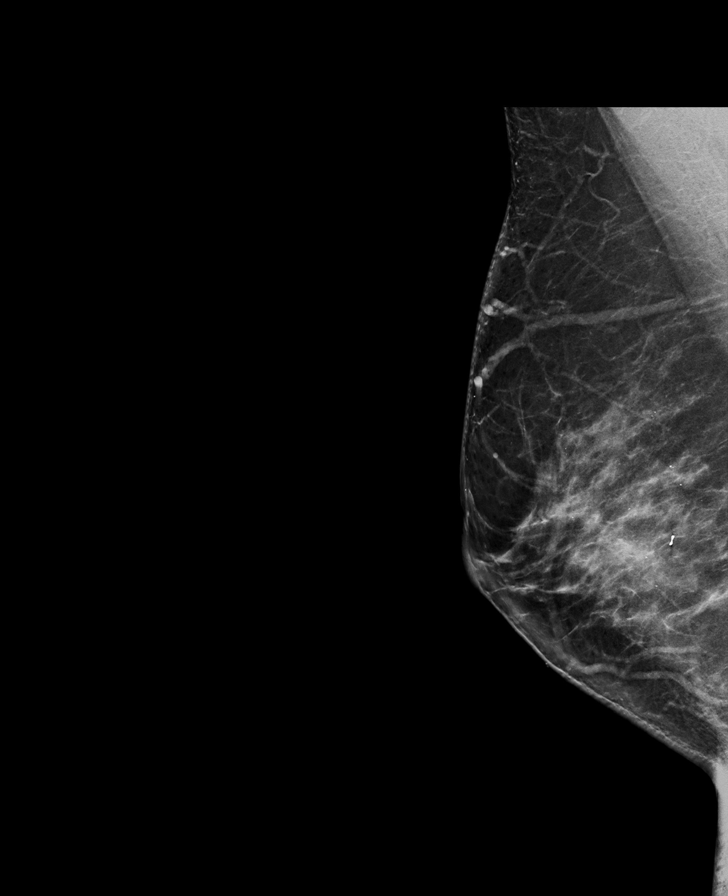

[R MLO tomo · tomo slice 43/86.0]
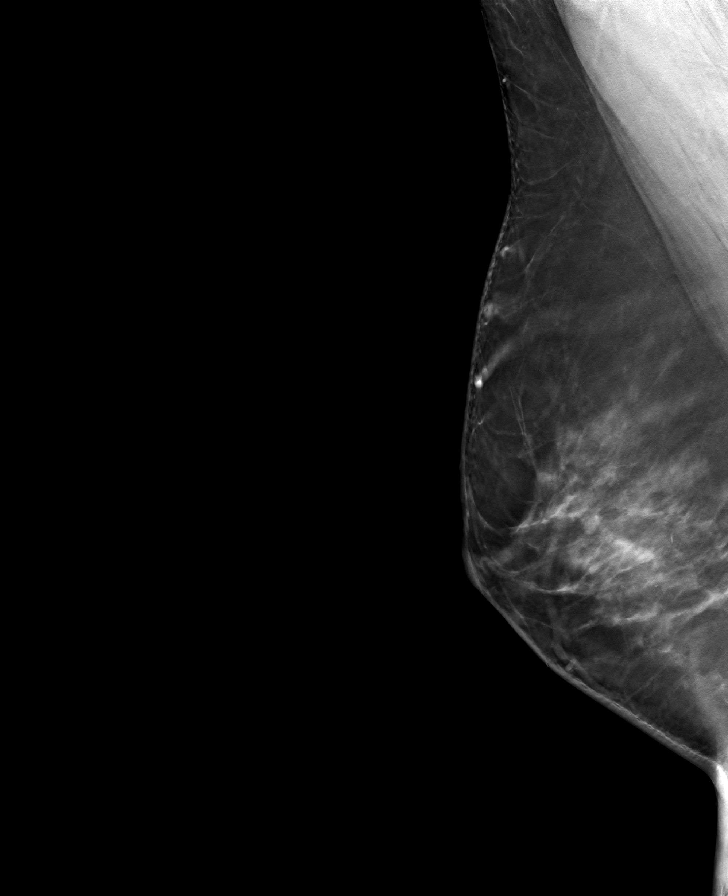

[R CC tomo · tomo slice 41/82.0]
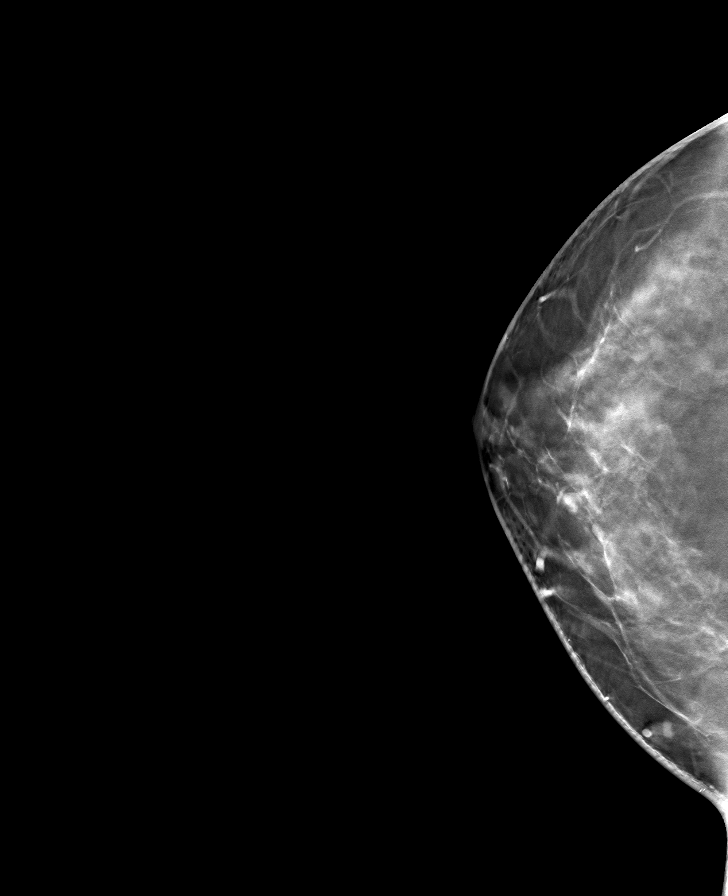

[L MLO tomo · tomo slice 44/87.0]
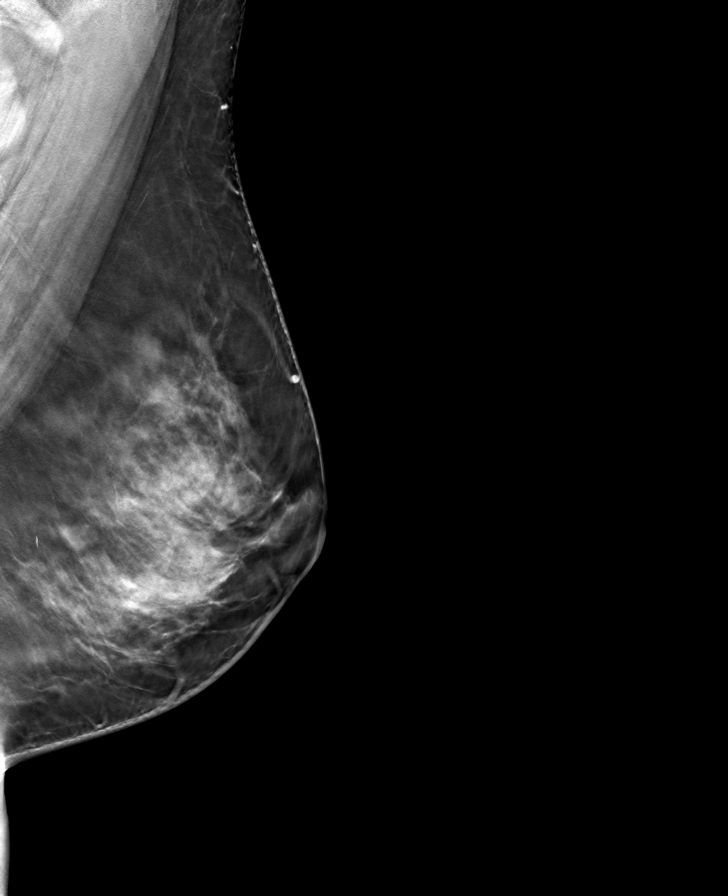

[L CC tomo · tomo slice 40/79.0]
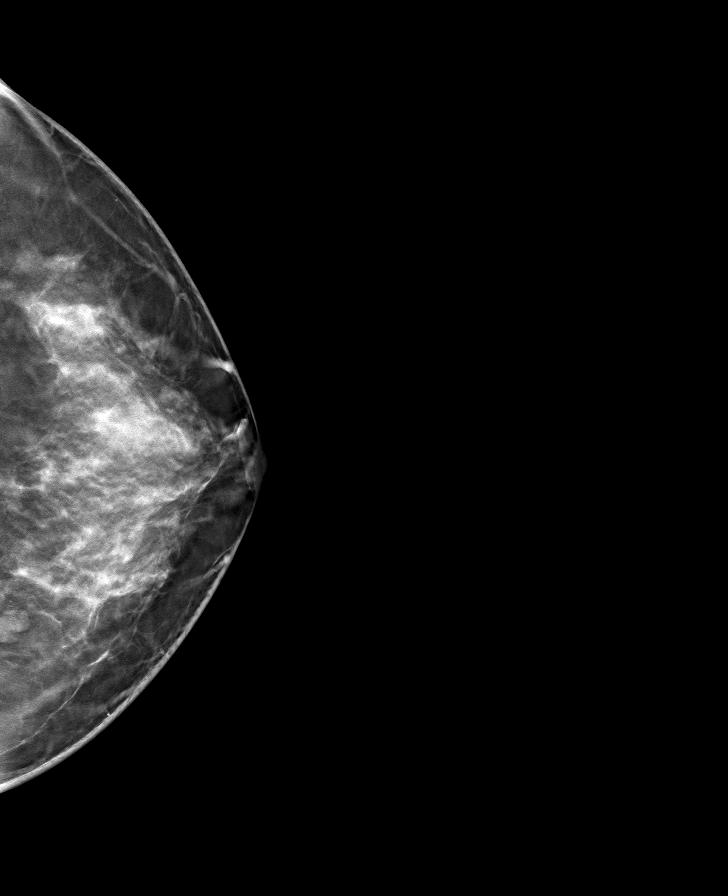

[8 of 24 positions shown; findings below may reference images not displayed]

ACR Breast Density Category c: The breast tissue is heterogeneously
dense, which may obscure small masses.
FINDINGS: A circumscribed mass in the upper central right breast measures 5 mm
and corresponds to the previously documented sonographic mass at 12
o'clock 2 cm from the nipple. No suspicious mass,
microcalcification, or other finding is identified in the left
breast.

Mammographic images were processed with CAD.

Targeted right breast ultrasound was performed.

At 12 o'clock 2 cm from the nipple an oval circumscribed hypoechoic
mass measures 5 x 3 x 6 mm. This is not significantly changed since
[DATE].

At 2 o'clock 2 cm from the nipple an oval circumscribed hypoechoic
mass measures 4 x 4 x 10 mm. This is not significantly changed since
[DATE].
IMPRESSION: Circumscribed masses in the right breast are not significantly
changed since [DATE] and remain probably benign. No evidence of
malignancy in the left breast.

RECOMMENDATION:
Recommend a follow-up right breast ultrasound in 12 months to
document 2 years of stability. At that time, the patient will be due
for routine annual mammogram.

I have discussed the findings and recommendations with the patient.
If applicable, a reminder letter will be sent to the patient
regarding the next appointment.

BI-RADS CATEGORY  3: Probably benign.

## 2018-12-26 IMAGING — US US BREAST*R* LIMITED INC AXILLA
1 series · 10 of 10 positions shown · non-contrast
Comparison: Previous exam(s).

CLINICAL DATA: Here for 1 year follow-up of probably benign masses
in the right breast.

EXAM:
DIGITAL DIAGNOSTIC BILATERAL MAMMOGRAM WITH CAD AND TOMO
ULTRASOUND RIGHT BREAST

[Series 1: us breast*right* limited inc axilla · 0.06mm/px · 10 of 10 slices shown]
[im 1/10]
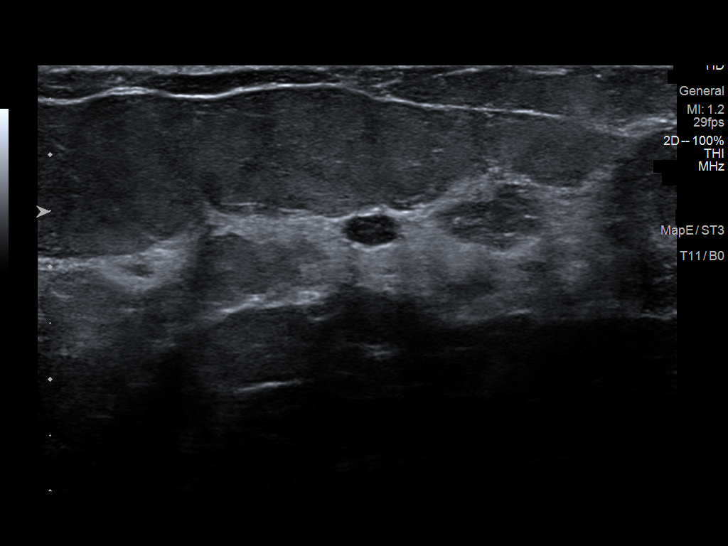
[im 2/10]
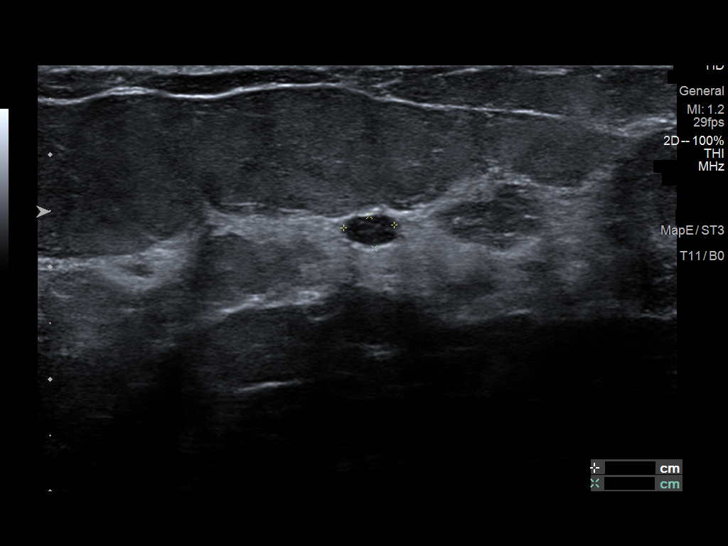
[im 3/10]
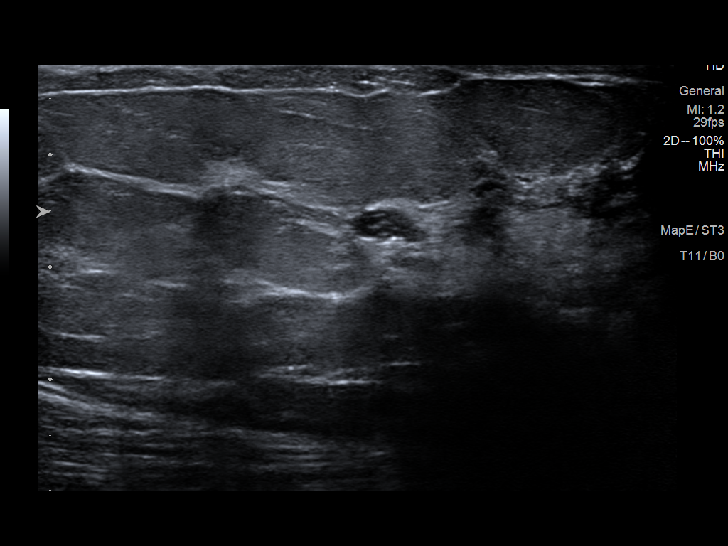
[im 4/10]
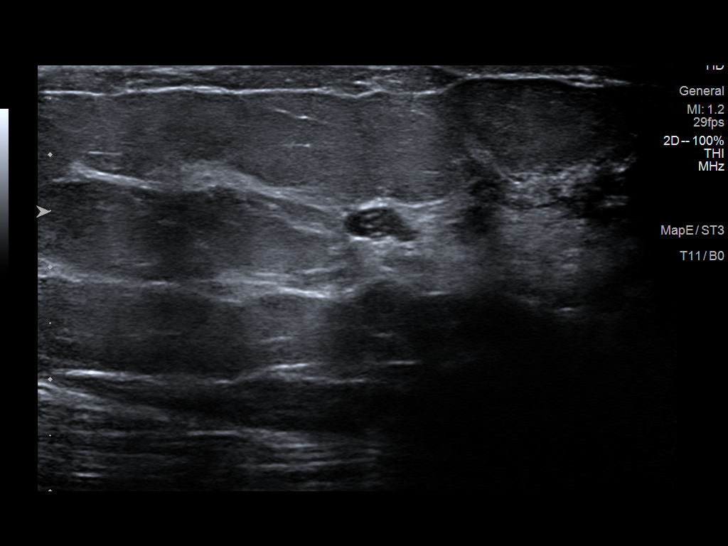
[im 5/10]
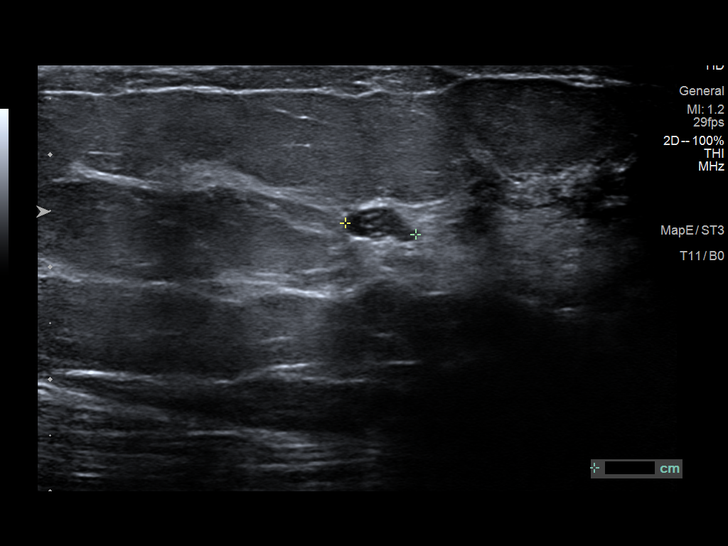
[im 6/10]
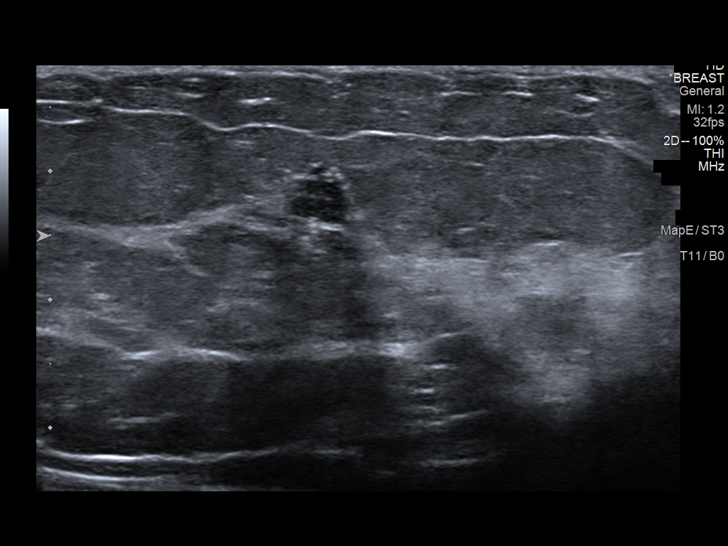
[im 7/10]
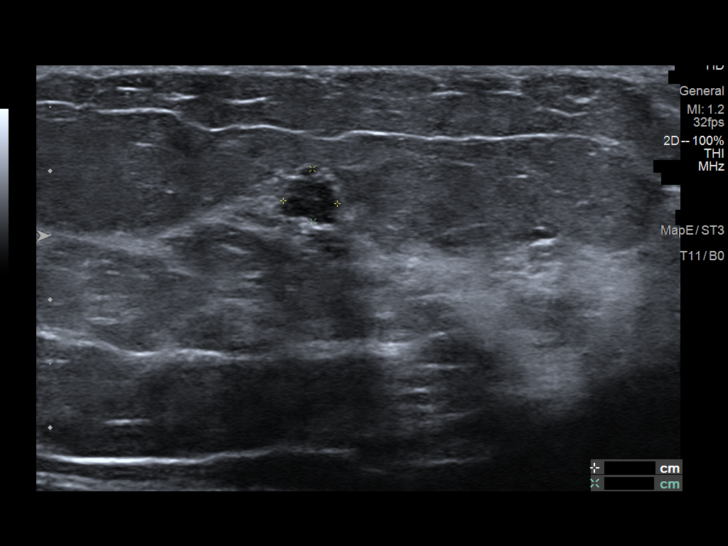
[im 8/10]
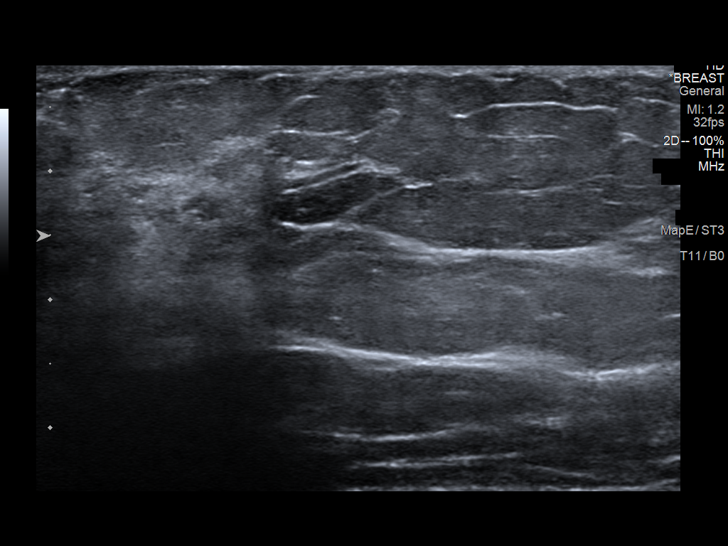
[im 9/10]
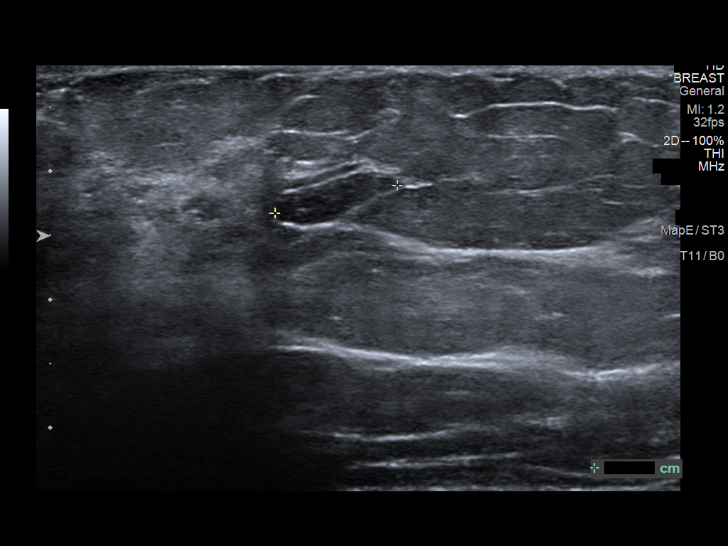
[im 10/10]
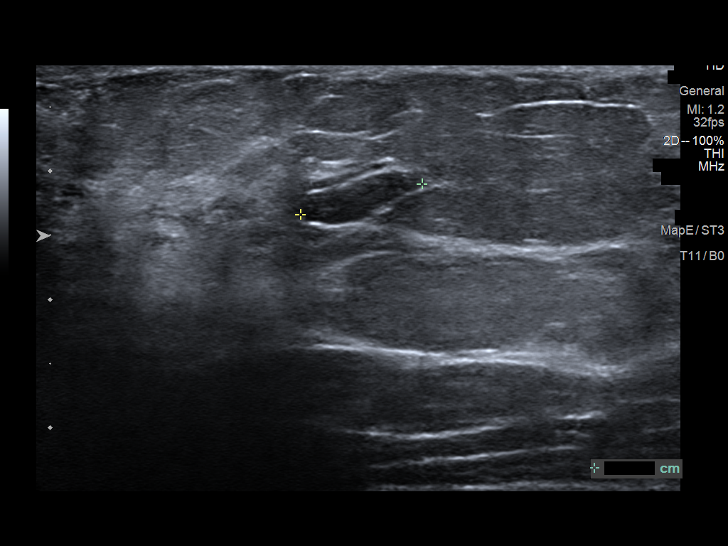

[10 of 10 positions shown; findings below may reference images not displayed]

ACR Breast Density Category c: The breast tissue is heterogeneously
dense, which may obscure small masses.
FINDINGS: A circumscribed mass in the upper central right breast measures 5 mm
and corresponds to the previously documented sonographic mass at 12
o'clock 2 cm from the nipple. No suspicious mass,
microcalcification, or other finding is identified in the left
breast.

Mammographic images were processed with CAD.

Targeted right breast ultrasound was performed.

At 12 o'clock 2 cm from the nipple an oval circumscribed hypoechoic
mass measures 5 x 3 x 6 mm. This is not significantly changed since
[DATE].

At 2 o'clock 2 cm from the nipple an oval circumscribed hypoechoic
mass measures 4 x 4 x 10 mm. This is not significantly changed since
[DATE].
IMPRESSION: Circumscribed masses in the right breast are not significantly
changed since [DATE] and remain probably benign. No evidence of
malignancy in the left breast.

RECOMMENDATION:
Recommend a follow-up right breast ultrasound in 12 months to
document 2 years of stability. At that time, the patient will be due
for routine annual mammogram.

I have discussed the findings and recommendations with the patient.
If applicable, a reminder letter will be sent to the patient
regarding the next appointment.

BI-RADS CATEGORY  3: Probably benign.

## 2019-07-07 ENCOUNTER — Other Ambulatory Visit: Payer: Self-pay | Admitting: Ophthalmology

## 2019-07-07 DIAGNOSIS — H53462 Homonymous bilateral field defects, left side: Secondary | ICD-10-CM

## 2019-07-16 ENCOUNTER — Other Ambulatory Visit: Payer: Self-pay | Admitting: *Deleted

## 2019-07-16 ENCOUNTER — Other Ambulatory Visit: Payer: Self-pay | Admitting: Family Medicine

## 2019-07-16 DIAGNOSIS — N6310 Unspecified lump in the right breast, unspecified quadrant: Secondary | ICD-10-CM

## 2019-08-04 ENCOUNTER — Other Ambulatory Visit: Payer: Self-pay | Admitting: Ophthalmology

## 2019-08-04 ENCOUNTER — Ambulatory Visit
Admission: RE | Admit: 2019-08-04 | Discharge: 2019-08-04 | Disposition: A | Discharge status: Home / Self Care | Payer: PRIVATE HEALTH INSURANCE | Source: Ambulatory Visit | Attending: Ophthalmology | Admitting: Ophthalmology

## 2019-08-04 DIAGNOSIS — H53462 Homonymous bilateral field defects, left side: Secondary | ICD-10-CM

## 2019-08-04 IMAGING — MR MR HEAD WO/W CM
12 of 13 series · 43 of 48 positions shown · IV contrast (18 ml multihance)
Comparison: None

CLINICAL DATA: Homonymous bilateral field defects, left side.

EXAM:
MRI HEAD WITHOUT AND WITH CONTRAST
TECHNIQUE: Multiplanar, multiecho pulse sequences of the brain and surrounding
structures were obtained without and with intravenous contrast.
CONTRAST:  18mL MULTIHANCE GADOBENATE DIMEGLUMINE 529 MG/ML IV SOLN

[Series 2: T1 · sagittal · 5.0mm · 0.45mm/px · 1 of 21 slices shown]
[im 1/21]
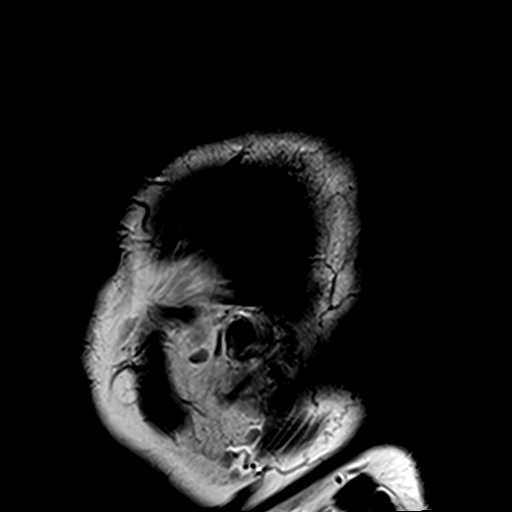

[Series 3: T2 · axial · 5.0mm · 0.51mm/px · z∈[-57,+79]mm · 2 of 22 slices shown]
[im 1/22]
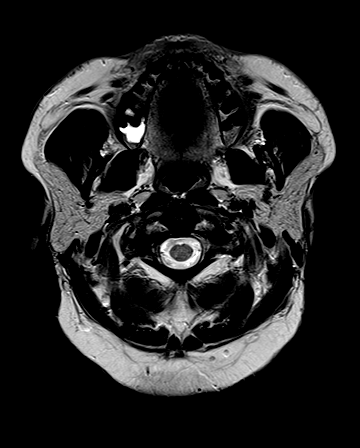
[im 22/22]
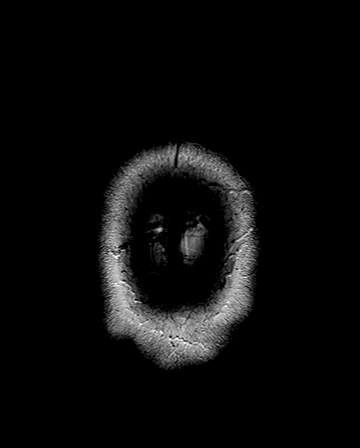

[Series 4: DWI · axial · 3.0mm · 1.80mm/px · z∈[-62,+86]mm · 7 of 92 slices shown (1 of 2)]
[im 1/92]
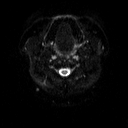
[im 16/92]
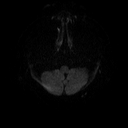
[im 31/92]
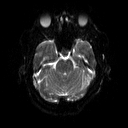
[im 46/92]
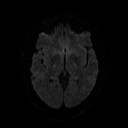
[im 61/92]
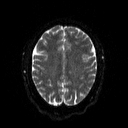
[im 76/92]
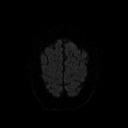
[im 92/92]
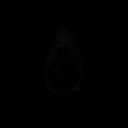

[Series 5: DWI · axial · 3.0mm · 1.80mm/px · z∈[-62,+86]mm · 3 of 45 slices shown (2 of 2)]
[im 1/45]
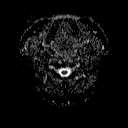
[im 23/45]
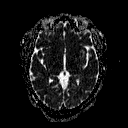
[im 45/45]
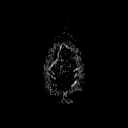

[Series 7: swi_images · axial · 2.0mm · 0.90mm/px · z∈[-60,+82]mm · 5 of 72 slices shown]
[im 1/72]
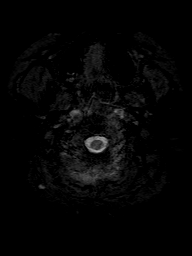
[im 18/72]
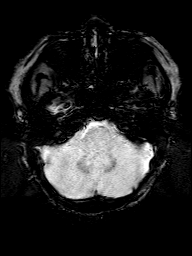
[im 36/72]
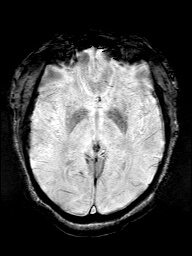
[im 54/72]
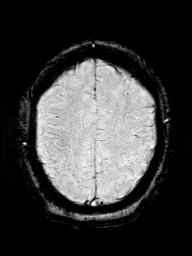
[im 72/72]
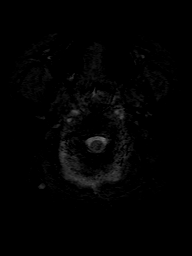

[Series 8: FLAIR · axial · 3.0mm · 0.45mm/px · z∈[-60,+84]mm · 2 of 30 slices shown]
[im 1/30]
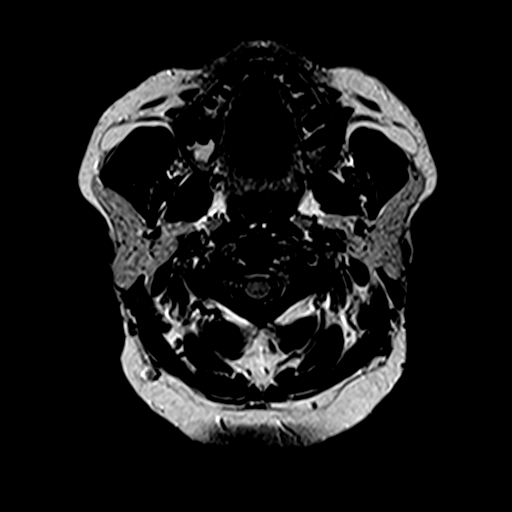
[im 30/30]
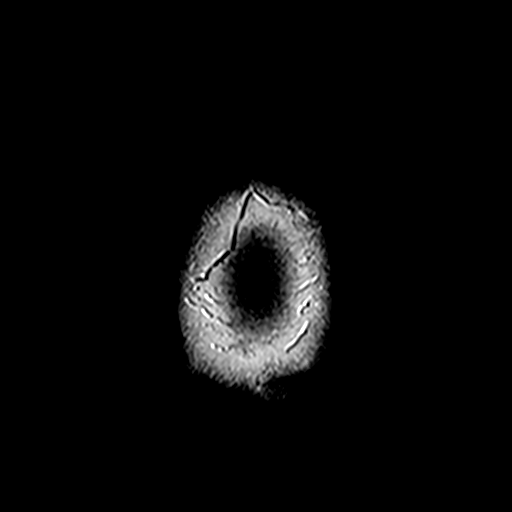

[Series 9: t1_mpr_tra · axial · 1.0mm · 0.75mm/px · z∈[-60,+83]mm · 8 of 144 slices shown]
[im 1/144]
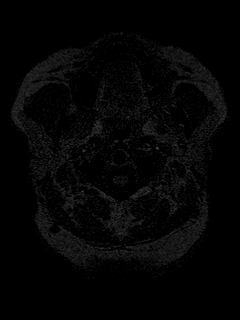
[im 16/144]
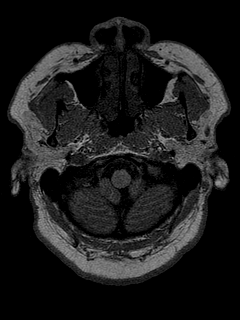
[im 48/144]
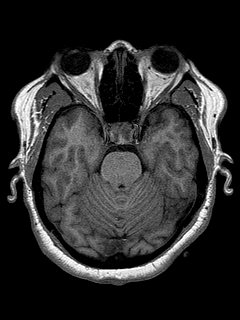
[im 64/144]
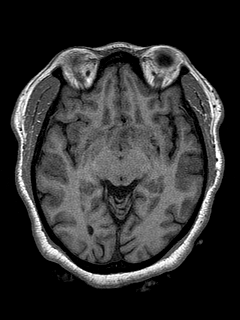
[im 80/144]
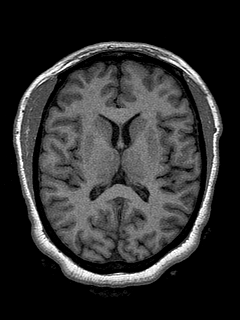
[im 96/144]
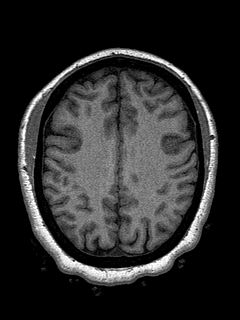
[im 128/144]
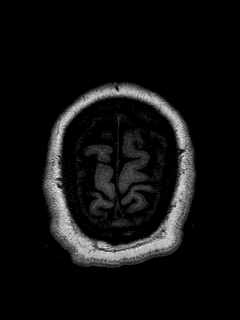
[im 144/144]
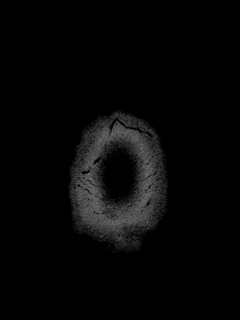

[Series 10: T2 post-contrast · coronal · 5.0mm · 0.45mm/px · 2 of 24 slices shown]
[im 1/24]
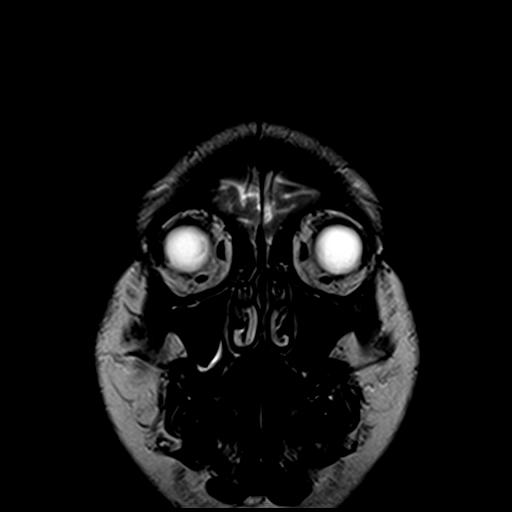
[im 24/24]
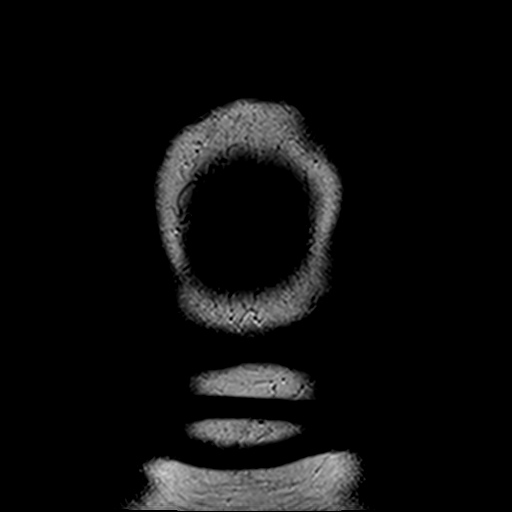

[Series 11: t1_mpr_tra post · axial · 1.0mm · 0.75mm/px · z∈[-60,+83]mm · 8 of 144 slices shown]
[im 1/144]
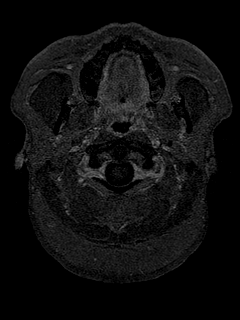
[im 16/144]
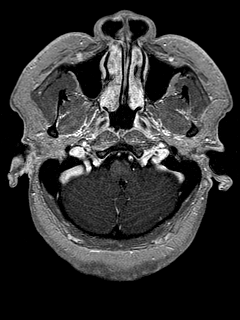
[im 48/144]
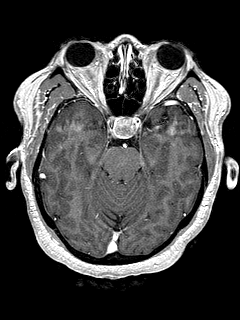
[im 64/144]
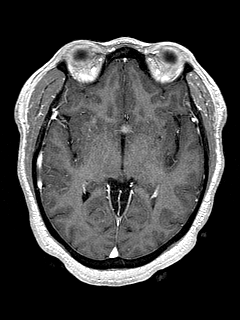
[im 80/144]
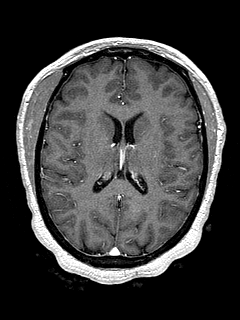
[im 96/144]
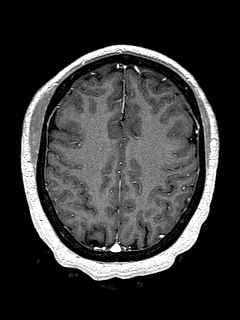
[im 128/144]
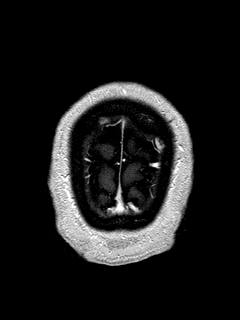
[im 144/144]
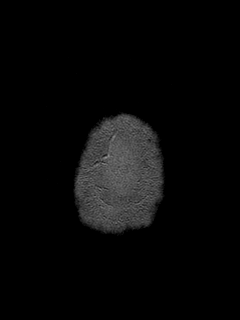

[Series 12: post cor · coronal · 5.0mm · 0.45mm/px · 2 of 24 slices shown]
[im 1/24]
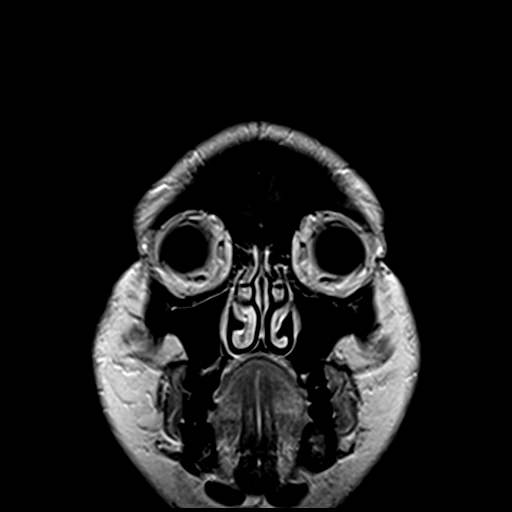
[im 24/24]
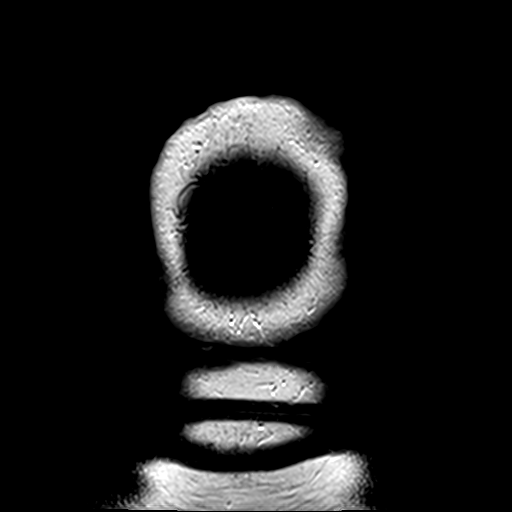

[Series 13: T1 post-contrast · sagittal · 5.0mm · 0.45mm/px · 2 of 21 slices shown]
[im 1/21]
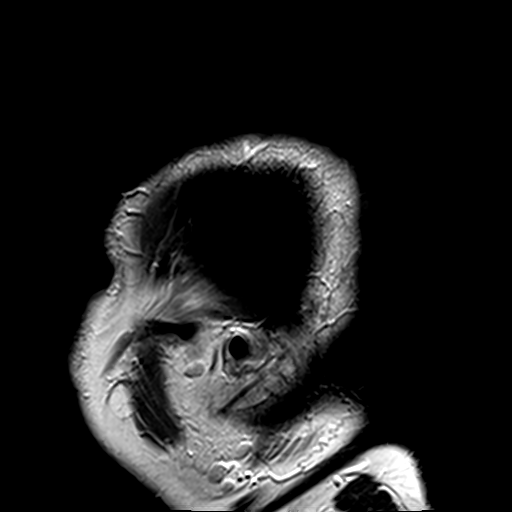
[im 21/21]
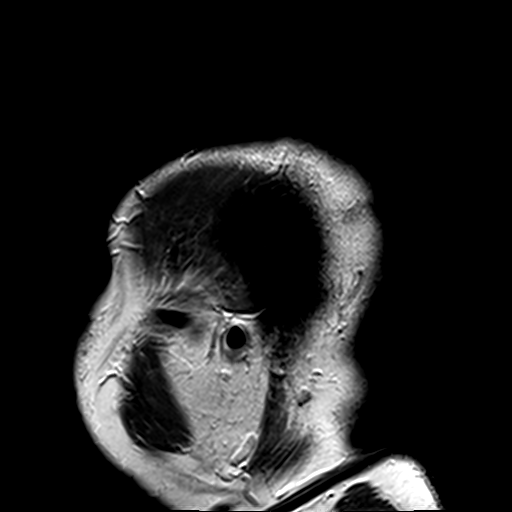

[Series 14: post sag 3mm · sagittal · 3.0mm · 0.33mm/px · 1 of 11 slices shown]
[im 1/11]
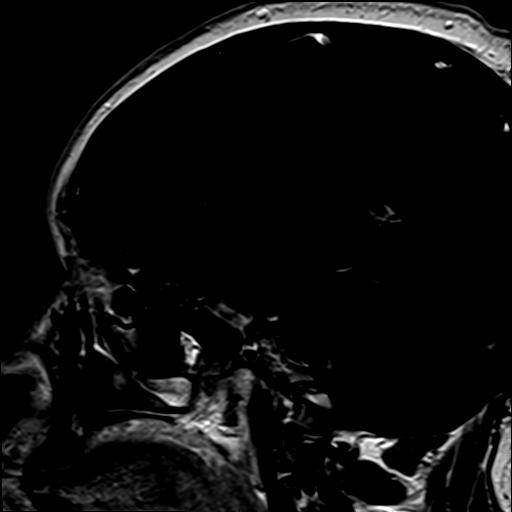

[43 of 48 positions shown; findings below may reference images not displayed]

FINDINGS: Brain: The suprasellar brain is unremarkable. No acute infarct,
hemorrhage, or mass lesion is present.

A somewhat heterogeneous DNIL enhancing pituitary mass measures 1.6 x
1.5 x 2.5 cm. Lesion extends superiorly, asymmetrically to the left.
The pituitary stalk is not visualized. The optic nerves and chiasm
are displaced tumor does not extend into the cavernous sinus.
Hypothalamus is displaced upward, but otherwise within normal
limits.

The ventricles are of normal size. No significant extraaxial fluid
collection is present.

The internal auditory canals are within normal limits. The brainstem
and cerebellum are within normal limits.

Postcontrast images demonstrate no other pathologic enhancement.

Vascular: Flow is present in the major intracranial arteries.

Skull and upper cervical spine: The craniocervical junction is
normal. Upper cervical spine is within normal limits. Marrow signal
is unremarkable.

Sinuses/Orbits: The paranasal sinuses and mastoid air cells are
clear. Globes and orbits are otherwise within normal limits
bilaterally.
IMPRESSION: 1. 1.6 x 1.5 x 2.5 cm pituitary mass compatible with a pituitary
macroadenoma.
2. The optic nerves and chiasm are displaced upward,, likely
associated with the left-sided field defects.
3. The suprasellar brain is within normal limits.

## 2019-08-04 MED ORDER — GADOBENATE DIMEGLUMINE 529 MG/ML IV SOLN
18.0000 mL | Freq: Once | INTRAVENOUS | Status: AC | PRN
  Administered 2019-08-04: 18 mL via INTRAVENOUS

## 2019-08-05 ENCOUNTER — Other Ambulatory Visit: Payer: Self-pay | Admitting: Family Medicine

## 2019-08-05 ENCOUNTER — Other Ambulatory Visit: Payer: Self-pay

## 2019-08-05 ENCOUNTER — Ambulatory Visit
Admission: RE | Admit: 2019-08-05 | Discharge: 2019-08-05 | Disposition: A | Discharge status: Home / Self Care | Payer: PRIVATE HEALTH INSURANCE | Source: Ambulatory Visit | Attending: Family Medicine | Admitting: Family Medicine

## 2019-08-05 DIAGNOSIS — N6310 Unspecified lump in the right breast, unspecified quadrant: Secondary | ICD-10-CM

## 2019-08-05 IMAGING — US US BREAST*R* LIMITED INC AXILLA
1 series · 5 of 5 positions shown · non-contrast
Comparison: Previous exam(s).

CLINICAL DATA: Palpable lump on the right.

EXAM:
DIGITAL DIAGNOSTIC RIGHT MAMMOGRAM WITH CAD AND TOMO
ULTRASOUND RIGHT BREAST

[Series 1: us breast*right* limited inc axilla · 0.04mm/px · 5 of 5 slices shown]
[im 1/5]
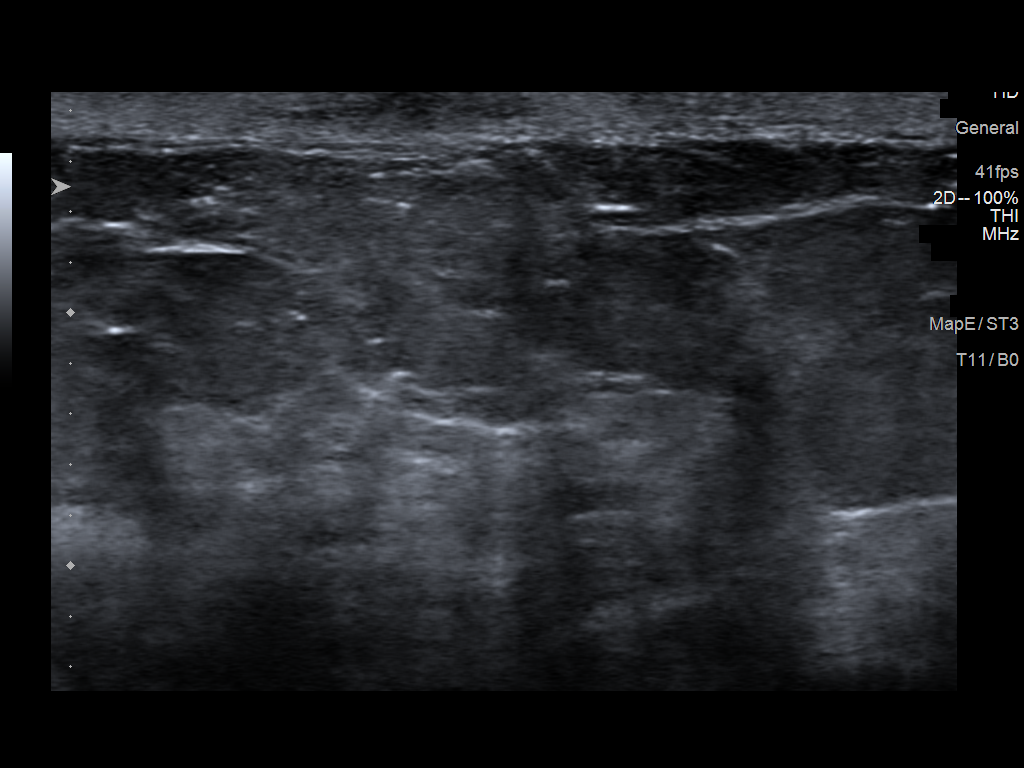
[im 2/5]
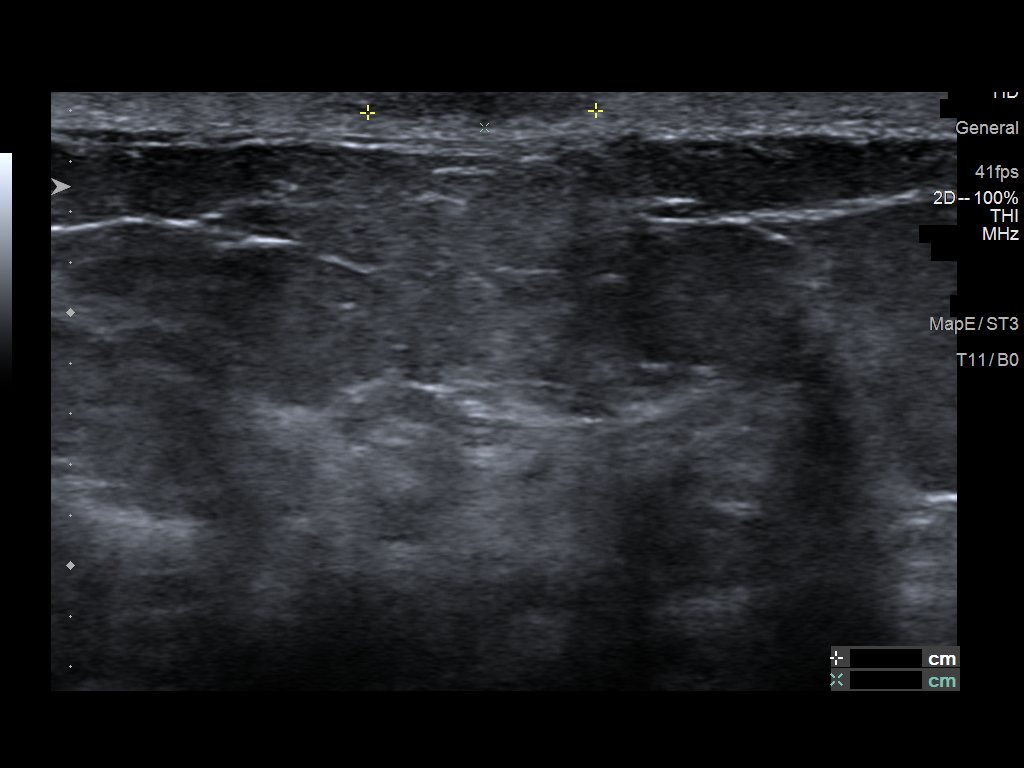
[im 3/5]
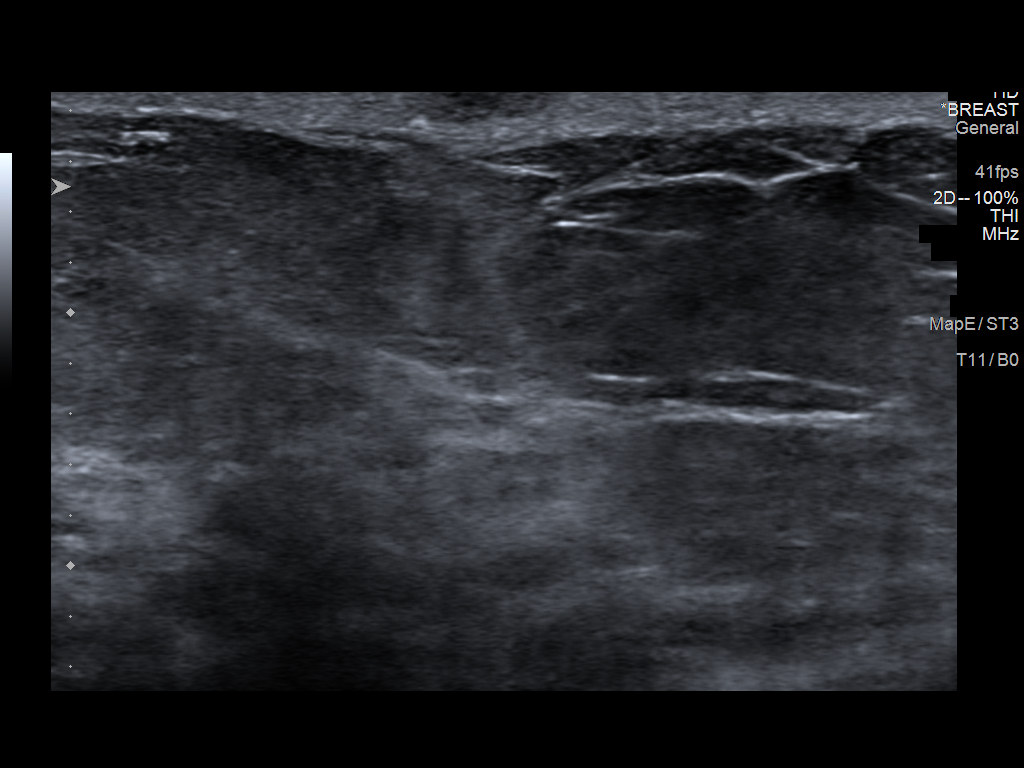
[im 4/5]
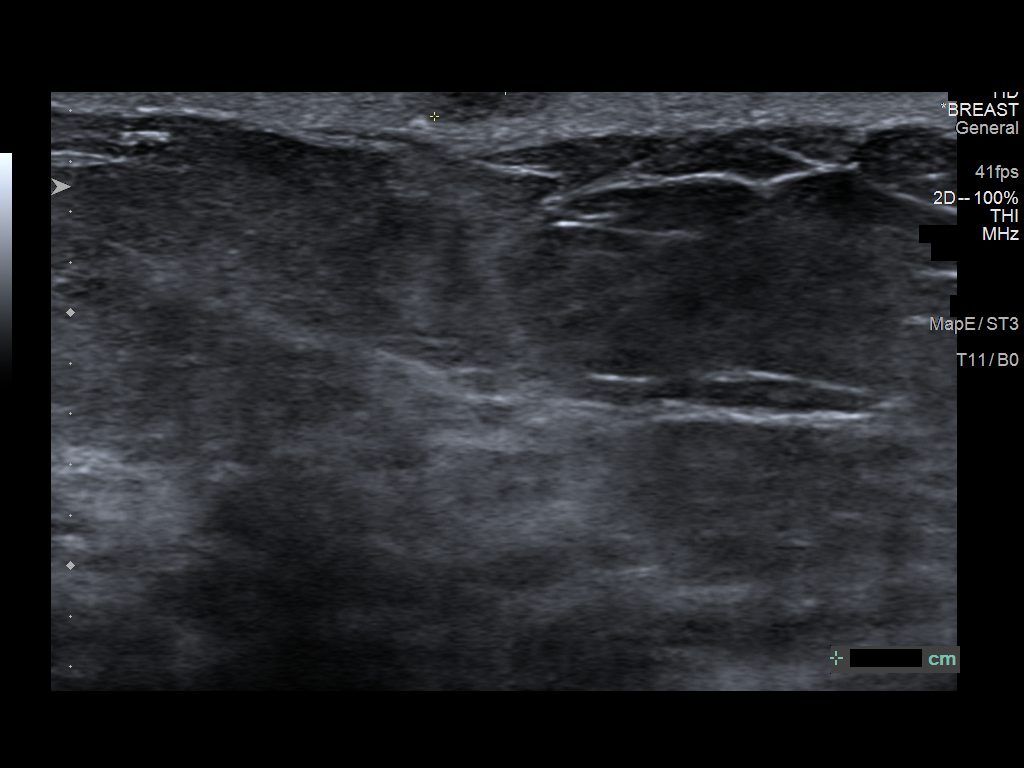
[im 5/5]
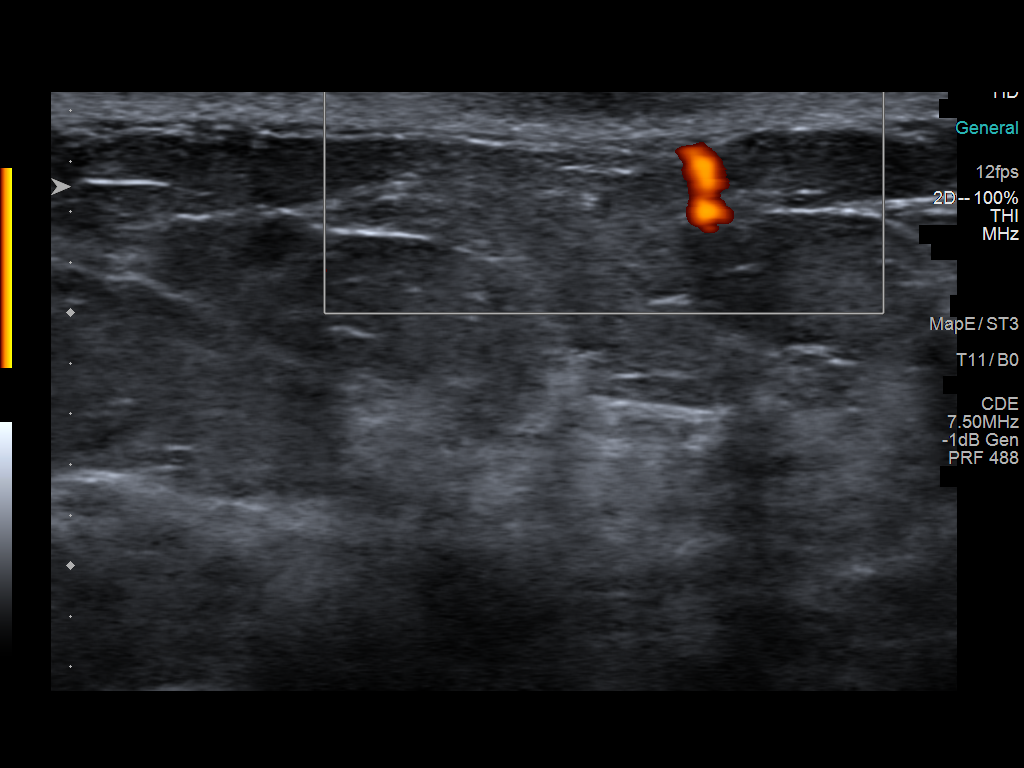

[5 of 5 positions shown; findings below may reference images not displayed]

ACR Breast Density Category c: The breast tissue is heterogeneously
dense, which may obscure small masses.
FINDINGS: No suspicious abnormalities in the region of the patient's right
palpable lump. No other mammographic changes.

Mammographic images were processed with CAD.

On physical exam, a superficial lump is pointed out by the patient.

Targeted ultrasound is performed, showing a small lesion within the
skin measuring 9 x 2 x 3 mm. No breast abnormalities otherwise seen.
IMPRESSION: The patient is palpating a small skin lesion. No evidence of breast
cancer.

RECOMMENDATION:
Recommend following the skin lesion to complete clinical resolution.
The patient is due for mammography in [DATE] for a final
2 year follow-up of right breast masses.

I have discussed the findings and recommendations with the patient.
If applicable, a reminder letter will be sent to the patient
regarding the next appointment.

BI-RADS CATEGORY  2: Benign.

## 2019-08-05 IMAGING — MG MM DIGITAL DIAGNOSTIC UNILAT*R* W/ TOMO W/ CAD
6 series · 6 of 18 positions shown · non-contrast
Comparison: Previous exam(s).

CLINICAL DATA: Palpable lump on the right.

EXAM:
DIGITAL DIAGNOSTIC RIGHT MAMMOGRAM WITH CAD AND TOMO
ULTRASOUND RIGHT BREAST

[R CC synth-2D]
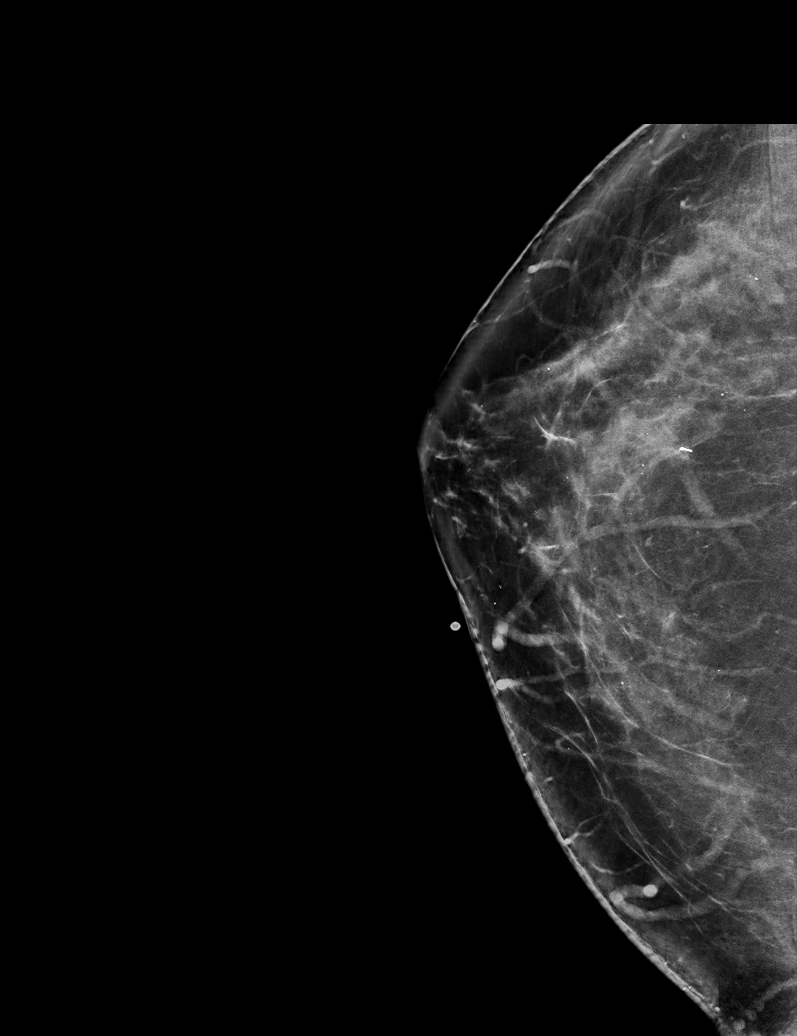

[R MLO synth-2D]
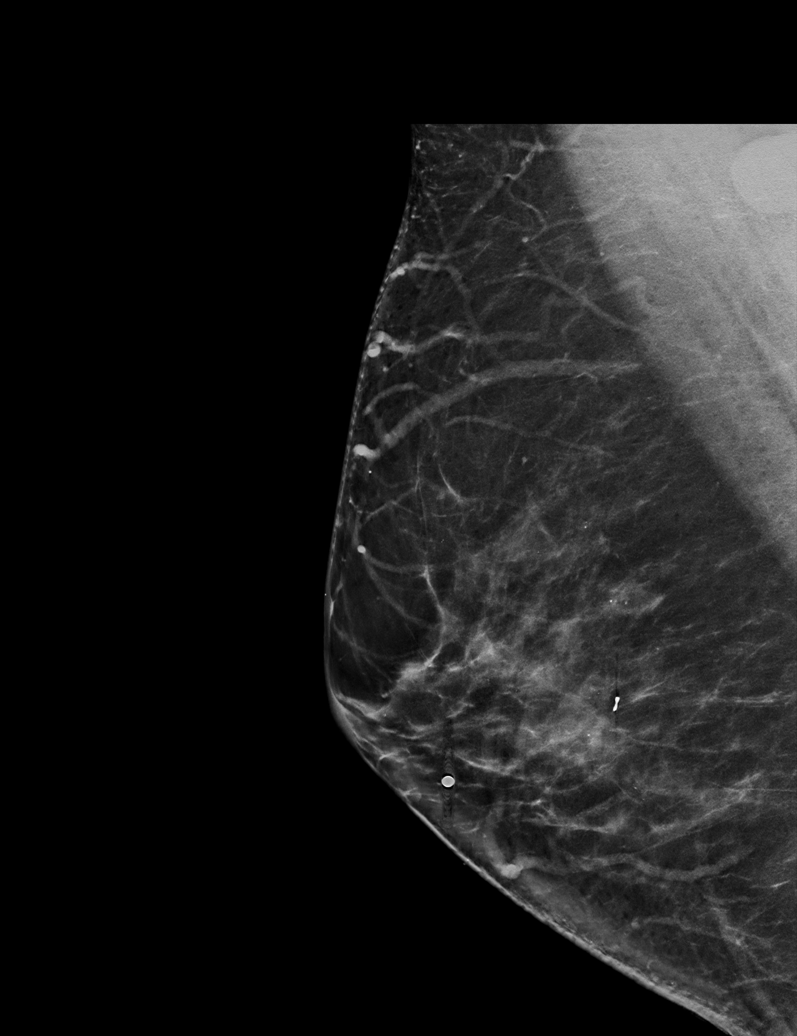

[R TAN synth-2D]
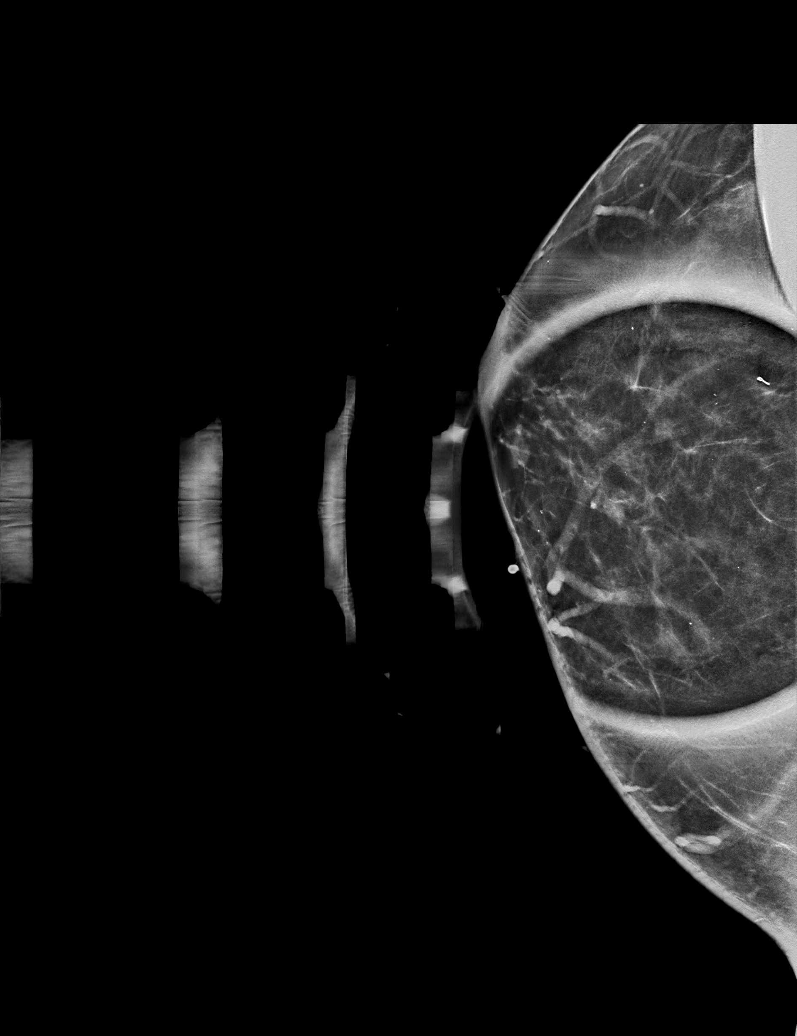

[R MLO tomo · tomo slice 45/89.0]
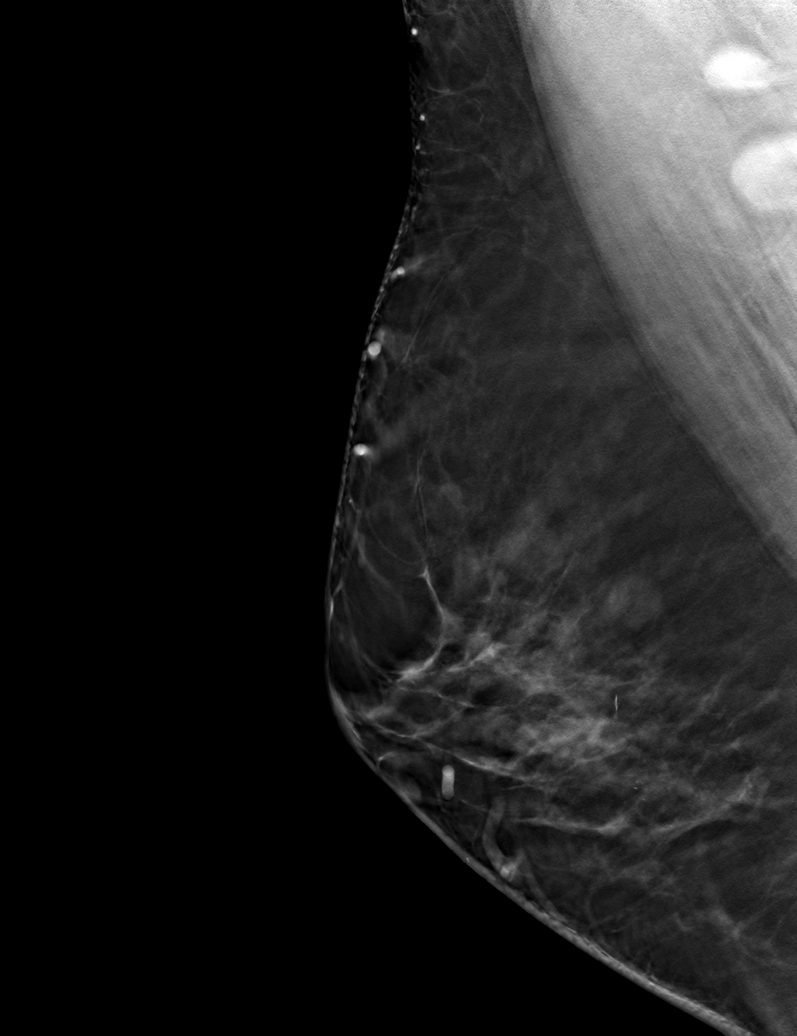

[R CC tomo · tomo slice 45/90.0]
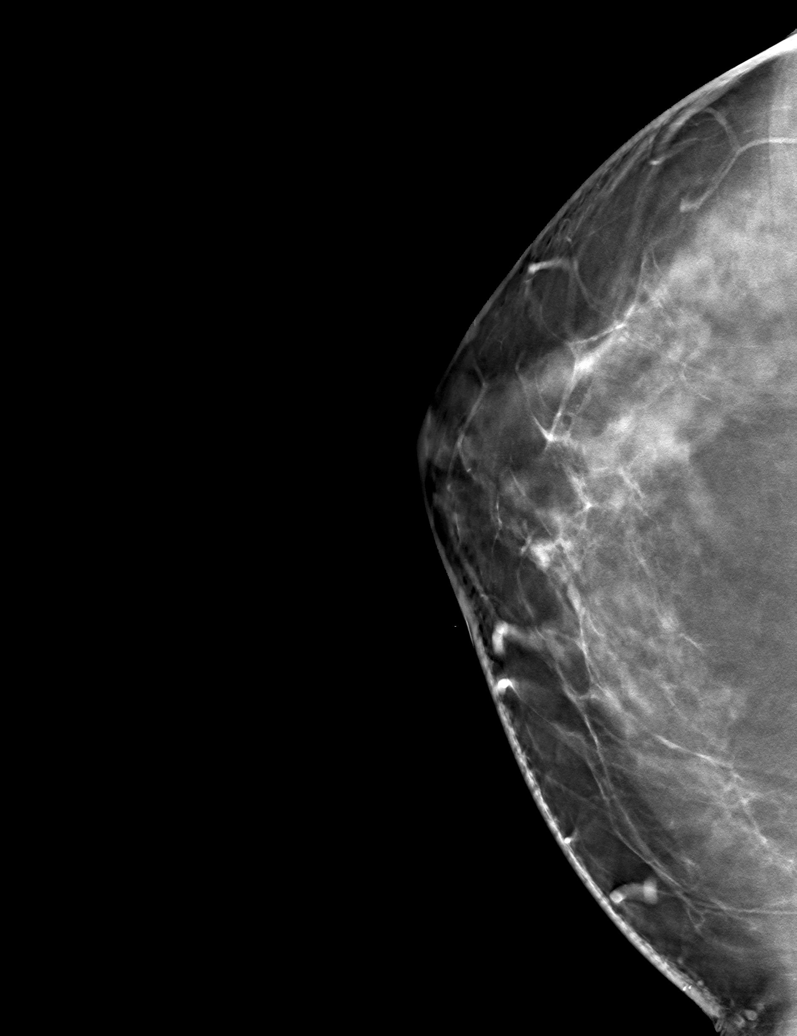

[R TAN tomo · tomo slice 36/71.0]
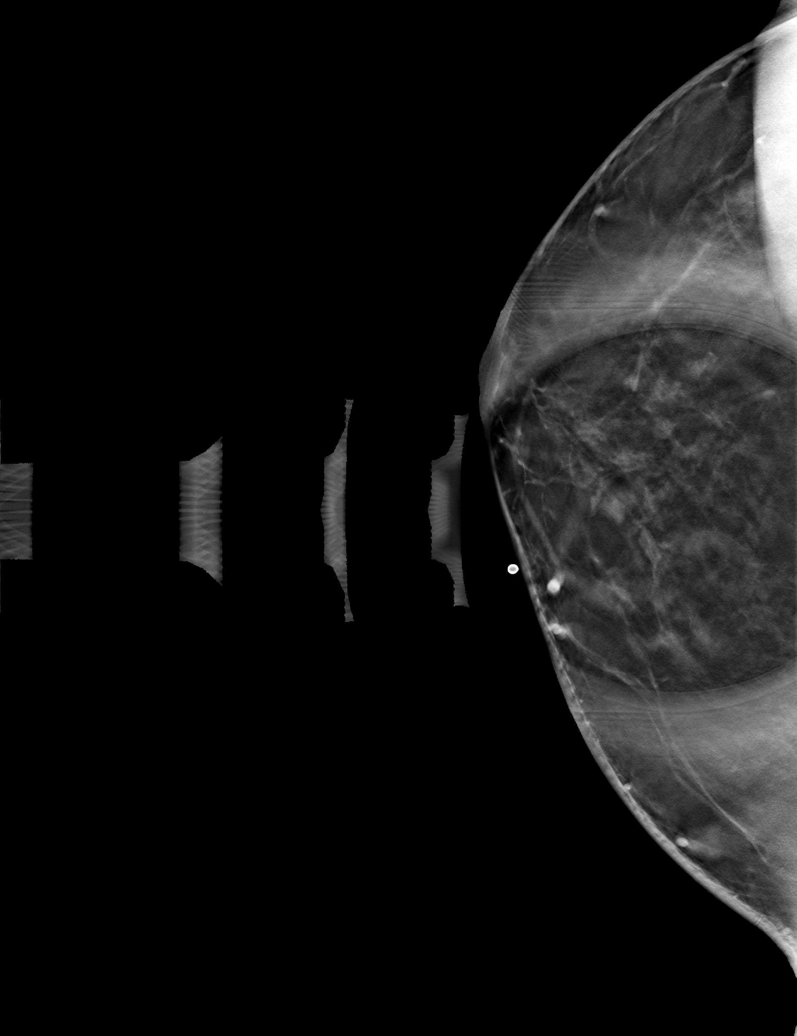

[6 of 18 positions shown; findings below may reference images not displayed]

ACR Breast Density Category c: The breast tissue is heterogeneously
dense, which may obscure small masses.
FINDINGS: No suspicious abnormalities in the region of the patient's right
palpable lump. No other mammographic changes.

Mammographic images were processed with CAD.

On physical exam, a superficial lump is pointed out by the patient.

Targeted ultrasound is performed, showing a small lesion within the
skin measuring 9 x 2 x 3 mm. No breast abnormalities otherwise seen.
IMPRESSION: The patient is palpating a small skin lesion. No evidence of breast
cancer.

RECOMMENDATION:
Recommend following the skin lesion to complete clinical resolution.
The patient is due for mammography in [DATE] for a final
2 year follow-up of right breast masses.

I have discussed the findings and recommendations with the patient.
If applicable, a reminder letter will be sent to the patient
regarding the next appointment.

BI-RADS CATEGORY  2: Benign.

## 2019-09-02 ENCOUNTER — Other Ambulatory Visit: Payer: Self-pay | Admitting: Neurological Surgery

## 2019-09-02 ENCOUNTER — Other Ambulatory Visit: Payer: Self-pay | Admitting: Otolaryngology

## 2019-09-27 NOTE — Pre-Procedure Instructions (Signed)
[id number] [address], [place] [address] Phone: [phone] Fax: [phone]    Your procedure is scheduled on Thurs., October 01, 2019 from 7:30AM-11:30AM  Report to [facility] Entrance "A" at 5:30AM  Call this number if you have problems the morning of surgery:  [phone]   Remember:  Do not eat or drink after midnight on June 9th    Take these medicines the morning of surgery with A SIP OF WATER: AmLODipine (NORVASC)  As of today, STOP taking all Aspirin (unless instructed by your doctor) and Other Aspirin containing products, Vitamins, Fish oils, and Herbal medications. Also stop all NSAIDS i.e. Advil, Ibuprofen, Motrin, Aleve, Anaprox, Naproxen, BC, Goody Powders, and all Supplements.  No Smoking of any kind, Tobacco, or Alcohol products 24 hours prior to your procedure. If you use a Cpap at night, you may bring all equipment for your overnight stay.    Special instructions: - Preparing For Surgery  Before surgery, you can play an important role. Because skin is not sterile, your skin needs to be as free of germs as possible. You can reduce the number of germs on your skin by washing with CHG (chlorahexidine gluconate) Soap before surgery.  CHG is an antiseptic cleaner which kills germs and bonds with the skin to continue killing germs even after washing.    Please do not use if you have an allergy to CHG or antibacterial soaps. If your skin becomes reddened/irritated stop using the CHG.  Do not shave (including legs and underarms) for at least 48 hours prior to first CHG shower. It is OK to shave your face.  Please follow these instructions carefully.   1. Shower the NIGHT BEFORE SURGERY and the MORNING OF SURGERY with CHG.   2. If you chose to wash your hair, wash your hair first as usual with your normal shampoo.  3. After you shampoo, rinse your hair and body thoroughly to remove the  shampoo.  4. Use CHG as you would any other liquid soap. You can apply CHG directly to the skin and wash gently with a scrungie or a clean washcloth.   5. Apply the CHG Soap to your body ONLY FROM THE NECK DOWN.  Do not use on open wounds or open sores. Avoid contact with your eyes, ears, mouth and genitals (private parts). Wash Face and genitals (private parts)  with your normal soap.  6. Wash thoroughly, paying special attention to the area where your surgery will be performed.  7. Thoroughly rinse your body with warm water from the neck down.  8. DO NOT shower/wash with your normal soap after using and rinsing off the CHG Soap.  9. Pat yourself dry with a CLEAN TOWEL.  10. Wear CLEAN PAJAMAS to bed the night before surgery, wear comfortable clothes the morning of surgery  11. Place CLEAN SHEETS on your bed the night of your first shower and DO NOT SLEEP WITH PETS.   Day of Surgery:             Remember to brush your teeth WITH YOUR REGULAR TOOTHPASTE.  Do not wear jewelry, make-up or nail polish.  Do not wear lotions, powders, or perfumes, or deodorant.  Do not shave 48 hours prior to surgery.    Do not bring valuables to the hospital.  [facility] is not responsible for any belongings or valuables.  Contacts, dentures or bridgework may not be worn into surgery.  For patients admitted to the hospital, discharge time will be determined by your treatment team.  Patients discharged the day of surgery will not be allowed to drive home, and someone age 55 and over needs to stay with them for 24 hours.   Please wear clean clothes to the hospital/surgery center.    Please read over the following fact sheets that you were given.

## 2019-09-28 ENCOUNTER — Other Ambulatory Visit (HOSPITAL_COMMUNITY)
Admission: RE | Admit: 2019-09-28 | Discharge: 2019-09-28 | Disposition: A | Discharge status: Home / Self Care | Payer: PRIVATE HEALTH INSURANCE | Source: Ambulatory Visit | Attending: Neurological Surgery | Admitting: Neurological Surgery

## 2019-09-28 ENCOUNTER — Encounter (HOSPITAL_COMMUNITY)
Admission: RE | Admit: 2019-09-28 | Discharge: 2019-09-28 | Disposition: A | Discharge status: Home / Self Care | Payer: PRIVATE HEALTH INSURANCE | Source: Ambulatory Visit | Attending: Neurological Surgery | Admitting: Neurological Surgery

## 2019-09-28 ENCOUNTER — Other Ambulatory Visit: Payer: Self-pay

## 2019-09-28 ENCOUNTER — Encounter (HOSPITAL_COMMUNITY): Payer: Self-pay

## 2019-09-28 DIAGNOSIS — Z20822 Contact with and (suspected) exposure to covid-19: Secondary | ICD-10-CM | POA: Insufficient documentation

## 2019-09-28 DIAGNOSIS — Z01818 Encounter for other preprocedural examination: Secondary | ICD-10-CM | POA: Insufficient documentation

## 2019-09-28 LAB — CBC
HCT: 35.2 % — ABNORMAL LOW (ref 36.0–46.0)
Hemoglobin: 11.1 g/dL — ABNORMAL LOW (ref 12.0–15.0)
MCH: 27.5 pg (ref 26.0–34.0)
MCHC: 31.5 g/dL (ref 30.0–36.0)
MCV: 87.3 fL (ref 80.0–100.0)
Platelets: 375 10*3/uL (ref 150–400)
RBC: 4.03 MIL/uL (ref 3.87–5.11)
RDW: 15.4 % (ref 11.5–15.5)
WBC: 5.8 10*3/uL (ref 4.0–10.5)
nRBC: 0 % (ref 0.0–0.2)

## 2019-09-28 LAB — BASIC METABOLIC PANEL
Anion gap: 10 (ref 5–15)
BUN: 8 mg/dL (ref 6–20)
CO2: 26 mmol/L (ref 22–32)
Calcium: 8.6 mg/dL — ABNORMAL LOW (ref 8.9–10.3)
Chloride: 103 mmol/L (ref 98–111)
Creatinine, Ser: 0.71 mg/dL (ref 0.44–1.00)
GFR calc Af Amer: >60 mL/min (ref >60)
GFR calc non Af Amer: >60 mL/min (ref >60)
Glucose, Bld: 91 mg/dL (ref 70–99)
Potassium: 2.9 mmol/L — ABNORMAL LOW (ref 3.5–5.1)
Sodium: 139 mmol/L (ref 135–145)

## 2019-09-28 LAB — TYPE AND SCREEN
ABO/RH(D): O POS
Antibody Screen: NEGATIVE

## 2019-09-28 LAB — ABO/RH: ABO/RH(D): O POS

## 2019-09-28 NOTE — Progress Notes (Signed)
Abnormal lab value @ preadmission appointment.  Potassium 2.9   Dr. Wilburn Cornelia & Dr. Zada Finders notified via in-box message.    Chart sent to anesthesia for review.

## 2019-09-28 NOTE — Progress Notes (Signed)
PCP - Rachell Cipro   Chest x-ray - n/a EKG - 09-28-19  COVID TEST- Monday, 09-28-19   Anesthesia review: n/a  Patient denies shortness of breath, fever, cough and chest pain at PAT appointment   All instructions explained to the patient, with a verbal understanding of the material. Patient agrees to go over the instructions while at home for a better understanding. Patient also instructed to self quarantine after being tested for COVID-19. The opportunity to ask questions was provided.

## 2019-09-29 LAB — SARS CORONAVIRUS 2 (TAT 6-24 HRS): SARS Coronavirus 2: NEGATIVE

## 2019-09-29 NOTE — Anesthesia Preprocedure Evaluation (Addendum)
Anesthesia Evaluation  Patient identified by MRN, date of birth, ID band Patient awake    Reviewed: Allergy & Precautions, NPO status , Patient's Chart, lab work & pertinent test results  Airway Mallampati: II  TM Distance: >3 FB     Dental   Pulmonary    breath sounds clear to auscultation       Cardiovascular hypertension,  Rhythm:Regular Rate:Normal     Neuro/Psych    GI/Hepatic negative GI ROS, Neg liver ROS,   Endo/Other  negative endocrine ROS  Renal/GU negative Renal ROS     Musculoskeletal   Abdominal   Peds  Hematology   Anesthesia Other Findings   Reproductive/Obstetrics                            Anesthesia Physical Anesthesia Plan  ASA: III  Anesthesia Plan: General   Post-op Pain Management:    Induction: Intravenous  PONV Risk Score and Plan: 3 and Ondansetron, Dexamethasone and Midazolam  Airway Management Planned: Oral ETT  Additional Equipment:   Intra-op Plan:   Post-operative Plan: Possible Post-op intubation/ventilation  Informed Consent: I have reviewed the patients History and Physical, chart, labs and discussed the procedure including the risks, benefits and alternatives for the proposed anesthesia with the patient or authorized representative who has indicated his/her understanding and acceptance.     Dental advisory given  Plan Discussed with: CRNA and Anesthesiologist  Anesthesia Plan Comments: (preop labs with potassium 2.9. PAT RN sent inbox message to Drs. Ostergard and [employer]. Will recheck istat DOS. )       Anesthesia Quick Evaluation

## 2019-10-01 ENCOUNTER — Encounter (HOSPITAL_COMMUNITY): Payer: Self-pay | Admitting: Neurological Surgery

## 2019-10-01 ENCOUNTER — Inpatient Hospital Stay (HOSPITAL_COMMUNITY): Payer: PRIVATE HEALTH INSURANCE | Admitting: Physician Assistant

## 2019-10-01 ENCOUNTER — Encounter (HOSPITAL_COMMUNITY)
Admission: RE | Disposition: A | Discharge status: Home / Self Care | Payer: Self-pay | Source: Home / Self Care | Attending: Neurological Surgery

## 2019-10-01 ENCOUNTER — Other Ambulatory Visit: Payer: Self-pay

## 2019-10-01 ENCOUNTER — Inpatient Hospital Stay (HOSPITAL_COMMUNITY)
Admission: RE | Admit: 2019-10-01 | Discharge: 2019-10-03 | DRG: 615 | Disposition: A | Discharge status: Home / Self Care | Payer: PRIVATE HEALTH INSURANCE | Attending: Neurological Surgery | Admitting: Neurological Surgery

## 2019-10-01 DIAGNOSIS — E876 Hypokalemia: Secondary | ICD-10-CM | POA: Diagnosis present

## 2019-10-01 DIAGNOSIS — I10 Essential (primary) hypertension: Secondary | ICD-10-CM | POA: Diagnosis present

## 2019-10-01 DIAGNOSIS — H5347 Heteronymous bilateral field defects: Secondary | ICD-10-CM | POA: Diagnosis present

## 2019-10-01 DIAGNOSIS — Z20822 Contact with and (suspected) exposure to covid-19: Secondary | ICD-10-CM | POA: Diagnosis present

## 2019-10-01 DIAGNOSIS — D352 Benign neoplasm of pituitary gland: Secondary | ICD-10-CM | POA: Diagnosis present

## 2019-10-01 HISTORY — PX: CRANIOTOMY: SHX93

## 2019-10-01 HISTORY — PX: TRANSPHENOIDAL APPROACH EXPOSURE: SHX6311

## 2019-10-01 LAB — POCT I-STAT, CHEM 8
BUN: 13 mg/dL (ref 6–20)
Calcium, Ion: 1.18 mmol/L (ref 1.15–1.40)
Chloride: 100 mmol/L (ref 98–111)
Creatinine, Ser: 0.8 mg/dL (ref 0.44–1.00)
Glucose, Bld: 110 mg/dL — ABNORMAL HIGH (ref 70–99)
HCT: 31 % — ABNORMAL LOW (ref 36.0–46.0)
Hemoglobin: 10.5 g/dL — ABNORMAL LOW (ref 12.0–15.0)
Potassium: 2.9 mmol/L — ABNORMAL LOW (ref 3.5–5.1)
Sodium: 141 mmol/L (ref 135–145)
TCO2: 28 mmol/L (ref 22–32)

## 2019-10-01 LAB — CBC
HCT: 28.8 % — ABNORMAL LOW (ref 36.0–46.0)
Hemoglobin: 9.2 g/dL — ABNORMAL LOW (ref 12.0–15.0)
MCH: 27.5 pg (ref 26.0–34.0)
MCHC: 31.9 g/dL (ref 30.0–36.0)
MCV: 86.2 fL (ref 80.0–100.0)
Platelets: 354 10*3/uL (ref 150–400)
RBC: 3.34 MIL/uL — ABNORMAL LOW (ref 3.87–5.11)
RDW: 15.6 % — ABNORMAL HIGH (ref 11.5–15.5)
WBC: 9 10*3/uL (ref 4.0–10.5)
nRBC: 0 % (ref 0.0–0.2)

## 2019-10-01 LAB — POCT PREGNANCY, URINE: Preg Test, Ur: NEGATIVE

## 2019-10-01 LAB — CREATININE, SERUM
Creatinine, Ser: 0.32 mg/dL — ABNORMAL LOW (ref 0.44–1.00)
GFR calc Af Amer: >60 mL/min (ref >60)
GFR calc non Af Amer: >60 mL/min (ref >60)

## 2019-10-01 SURGERY — CRANIOTOMY HYPOPHYSECTOMY TRANSNASAL APPROACH
Anesthesia: General | Site: Nose

## 2019-10-01 MED ORDER — POLYETHYLENE GLYCOL 3350 17 G PO PACK: 17.0000 g | PACK | Freq: Every day | ORAL | Status: DC | PRN

## 2019-10-01 MED ORDER — MIDAZOLAM HCL 5 MG/5ML IJ SOLN
INTRAMUSCULAR | Status: DC | PRN
  Administered 2019-10-01: 2 mg via INTRAVENOUS

## 2019-10-01 MED ORDER — MIDAZOLAM HCL 2 MG/2ML IJ SOLN
INTRAMUSCULAR | Status: AC
  Filled 2019-10-01: qty 2

## 2019-10-01 MED ORDER — SUGAMMADEX SODIUM 200 MG/2ML IV SOLN
INTRAVENOUS | Status: DC | PRN
  Administered 2019-10-01: 50 mg via INTRAVENOUS
  Administered 2019-10-01 (×2): 100 mg via INTRAVENOUS
  Administered 2019-10-01: 50 mg via INTRAVENOUS

## 2019-10-01 MED ORDER — PHENYLEPHRINE 40 MCG/ML (10ML) SYRINGE FOR IV PUSH (FOR BLOOD PRESSURE SUPPORT)
PREFILLED_SYRINGE | INTRAVENOUS | Status: DC | PRN
  Administered 2019-10-01: 80 ug via INTRAVENOUS

## 2019-10-01 MED ORDER — CHLORHEXIDINE GLUCONATE CLOTH 2 % EX PADS
6.0000 | MEDICATED_PAD | Freq: Every day | CUTANEOUS | Status: DC
  Administered 2019-10-01 – 2019-10-03 (×3): 6 via TOPICAL

## 2019-10-01 MED ORDER — DEXAMETHASONE SODIUM PHOSPHATE 10 MG/ML IJ SOLN
INTRAMUSCULAR | Status: AC
  Filled 2019-10-01: qty 1

## 2019-10-01 MED ORDER — THROMBIN 5000 UNITS EX SOLR
CUTANEOUS | Status: AC
  Filled 2019-10-01: qty 5000

## 2019-10-01 MED ORDER — PROPOFOL 10 MG/ML IV BOLUS
INTRAVENOUS | Status: DC | PRN
  Administered 2019-10-01: 180 mg via INTRAVENOUS

## 2019-10-01 MED ORDER — OXYMETAZOLINE HCL 0.05 % NA SOLN
NASAL | Status: DC | PRN
  Administered 2019-10-01: 1 via TOPICAL

## 2019-10-01 MED ORDER — DEXMEDETOMIDINE HCL 200 MCG/2ML IV SOLN
INTRAVENOUS | Status: DC | PRN
  Administered 2019-10-01 (×2): 4 ug via INTRAVENOUS

## 2019-10-01 MED ORDER — FENTANYL CITRATE (PF) 250 MCG/5ML IJ SOLN
INTRAMUSCULAR | Status: AC
  Filled 2019-10-01: qty 5

## 2019-10-01 MED ORDER — PROMETHAZINE HCL 25 MG PO TABS: 12.5000 mg | ORAL_TABLET | ORAL | Status: DC | PRN

## 2019-10-01 MED ORDER — 0.9 % SODIUM CHLORIDE (POUR BTL) OPTIME
TOPICAL | Status: DC | PRN
  Administered 2019-10-01: 1000 mL

## 2019-10-01 MED ORDER — GLYCOPYRROLATE PF 0.2 MG/ML IJ SOSY
PREFILLED_SYRINGE | INTRAMUSCULAR | Status: DC | PRN
  Administered 2019-10-01: .1 mg via INTRAVENOUS

## 2019-10-01 MED ORDER — ROCURONIUM BROMIDE 10 MG/ML (PF) SYRINGE
PREFILLED_SYRINGE | INTRAVENOUS | Status: AC
  Filled 2019-10-01: qty 10

## 2019-10-01 MED ORDER — SODIUM CHLORIDE 0.9 % IR SOLN
Status: DC | PRN
  Administered 2019-10-01 (×2): 1000 mL

## 2019-10-01 MED ORDER — ONDANSETRON HCL 4 MG PO TABS: 4.0000 mg | ORAL_TABLET | ORAL | Status: DC | PRN

## 2019-10-01 MED ORDER — SODIUM CHLORIDE 0.9 % IV SOLN
INTRAVENOUS | Status: DC | PRN
  Administered 2019-10-01: 15 ug/min via INTRAVENOUS

## 2019-10-01 MED ORDER — LIDOCAINE 2% (20 MG/ML) 5 ML SYRINGE
INTRAMUSCULAR | Status: DC | PRN
  Administered 2019-10-01: 100 mg via INTRAVENOUS

## 2019-10-01 MED ORDER — LABETALOL HCL 5 MG/ML IV SOLN: 10.0000 mg | INTRAVENOUS | Status: DC | PRN

## 2019-10-01 MED ORDER — LOSARTAN POTASSIUM 50 MG PO TABS
100.0000 mg | ORAL_TABLET | Freq: Every day | ORAL | Status: DC
  Administered 2019-10-02 – 2019-10-03 (×2): 100 mg via ORAL
  Filled 2019-10-01 (×3): qty 2

## 2019-10-01 MED ORDER — HEMOSTATIC AGENTS (NO CHARGE) OPTIME
TOPICAL | Status: DC | PRN
  Administered 2019-10-01 (×2): 1 via TOPICAL

## 2019-10-01 MED ORDER — HYDROMORPHONE HCL 1 MG/ML IJ SOLN
0.5000 mg | INTRAMUSCULAR | Status: DC | PRN
  Administered 2019-10-01 (×2): 0.5 mg via INTRAVENOUS
  Filled 2019-10-01 (×2): qty 1

## 2019-10-01 MED ORDER — MUPIROCIN CALCIUM 2 % EX CREA
TOPICAL_CREAM | CUTANEOUS | Status: AC
  Filled 2019-10-01: qty 15

## 2019-10-01 MED ORDER — ACETAMINOPHEN 325 MG PO TABS
650.0000 mg | ORAL_TABLET | ORAL | Status: DC | PRN
  Administered 2019-10-01 – 2019-10-03 (×2): 650 mg via ORAL
  Filled 2019-10-01 (×2): qty 2

## 2019-10-01 MED ORDER — SODIUM CHLORIDE 0.9 % IV SOLN: INTRAVENOUS | Status: DC | PRN

## 2019-10-01 MED ORDER — CEFAZOLIN SODIUM-DEXTROSE 2-4 GM/100ML-% IV SOLN
2.0000 g | INTRAVENOUS | Status: AC
  Administered 2019-10-01: 2 g via INTRAVENOUS
  Filled 2019-10-01: qty 100

## 2019-10-01 MED ORDER — DEXAMETHASONE SODIUM PHOSPHATE 10 MG/ML IJ SOLN
INTRAMUSCULAR | Status: DC | PRN
  Administered 2019-10-01: 10 mg via INTRAVENOUS

## 2019-10-01 MED ORDER — LACTATED RINGERS IV SOLN
INTRAVENOUS | Status: DC | PRN
Start: 2019-10-01 — End: 2019-10-01

## 2019-10-01 MED ORDER — DOCUSATE SODIUM 100 MG PO CAPS
100.0000 mg | ORAL_CAPSULE | Freq: Two times a day (BID) | ORAL | Status: DC
  Administered 2019-10-01 – 2019-10-03 (×4): 100 mg via ORAL
  Filled 2019-10-01 (×4): qty 1

## 2019-10-01 MED ORDER — SODIUM CHLORIDE 0.9 % IV SOLN
0.0125 ug/kg/min | INTRAVENOUS | Status: DC
  Filled 2019-10-01: qty 1000

## 2019-10-01 MED ORDER — SUCCINYLCHOLINE CHLORIDE 20 MG/ML IJ SOLN
INTRAMUSCULAR | Status: DC | PRN
  Administered 2019-10-01: 150 mg via INTRAVENOUS

## 2019-10-01 MED ORDER — ORAL CARE MOUTH RINSE: 15.0000 mL | Freq: Once | OROMUCOSAL | Status: AC

## 2019-10-01 MED ORDER — LACTATED RINGERS IV SOLN: INTRAVENOUS | Status: DC

## 2019-10-01 MED ORDER — CEPHALEXIN 500 MG PO CAPS: 500.0000 mg | ORAL_CAPSULE | Freq: Three times a day (TID) | ORAL | 0 refills | Status: AC

## 2019-10-01 MED ORDER — SALINE SPRAY 0.65 % NA SOLN
4.0000 | NASAL | Status: DC | PRN
  Administered 2019-10-03 (×6): 4 via NASAL
  Filled 2019-10-01: qty 44

## 2019-10-01 MED ORDER — ORAL CARE MOUTH RINSE
15.0000 mL | Freq: Two times a day (BID) | OROMUCOSAL | Status: DC
  Administered 2019-10-01 – 2019-10-02 (×4): 15 mL via OROMUCOSAL

## 2019-10-01 MED ORDER — CHLORHEXIDINE GLUCONATE CLOTH 2 % EX PADS: 6.0000 | MEDICATED_PAD | Freq: Once | CUTANEOUS | Status: DC

## 2019-10-01 MED ORDER — LIDOCAINE-EPINEPHRINE 1 %-1:100000 IJ SOLN
INTRAMUSCULAR | Status: DC | PRN
  Administered 2019-10-01: 9 mL

## 2019-10-01 MED ORDER — CHLORHEXIDINE GLUCONATE 0.12 % MT SOLN
15.0000 mL | Freq: Once | OROMUCOSAL | Status: AC
  Administered 2019-10-01: 15 mL via OROMUCOSAL
  Filled 2019-10-01: qty 15

## 2019-10-01 MED ORDER — HYDROMORPHONE HCL 1 MG/ML IJ SOLN: 0.2500 mg | INTRAMUSCULAR | Status: DC | PRN

## 2019-10-01 MED ORDER — CEPHALEXIN 500 MG PO CAPS
500.0000 mg | ORAL_CAPSULE | Freq: Three times a day (TID) | ORAL | Status: DC
  Administered 2019-10-02 – 2019-10-03 (×5): 500 mg via ORAL
  Filled 2019-10-01 (×6): qty 1

## 2019-10-01 MED ORDER — PROPOFOL 10 MG/ML IV BOLUS
INTRAVENOUS | Status: AC
  Filled 2019-10-01: qty 40

## 2019-10-01 MED ORDER — FENTANYL CITRATE (PF) 100 MCG/2ML IJ SOLN
INTRAMUSCULAR | Status: DC | PRN
  Administered 2019-10-01 (×3): 50 ug via INTRAVENOUS
  Administered 2019-10-01: 100 ug via INTRAVENOUS

## 2019-10-01 MED ORDER — ACETAMINOPHEN 650 MG RE SUPP: 650.0000 mg | RECTAL | Status: DC | PRN

## 2019-10-01 MED ORDER — LIDOCAINE 2% (20 MG/ML) 5 ML SYRINGE
INTRAMUSCULAR | Status: AC
  Filled 2019-10-01: qty 5

## 2019-10-01 MED ORDER — AMLODIPINE BESYLATE 10 MG PO TABS
10.0000 mg | ORAL_TABLET | Freq: Every day | ORAL | Status: DC
  Administered 2019-10-02 – 2019-10-03 (×2): 10 mg via ORAL
  Filled 2019-10-01 (×2): qty 1

## 2019-10-01 MED ORDER — FAMOTIDINE IN NACL 20-0.9 MG/50ML-% IV SOLN
20.0000 mg | Freq: Two times a day (BID) | INTRAVENOUS | Status: DC
  Administered 2019-10-01 – 2019-10-02 (×4): 20 mg via INTRAVENOUS
  Filled 2019-10-01 (×4): qty 50

## 2019-10-01 MED ORDER — LIDOCAINE-EPINEPHRINE 1 %-1:100000 IJ SOLN
INTRAMUSCULAR | Status: AC
  Filled 2019-10-01: qty 1

## 2019-10-01 MED ORDER — ONDANSETRON HCL 4 MG/2ML IJ SOLN
INTRAMUSCULAR | Status: DC | PRN
  Administered 2019-10-01: 4 mg via INTRAVENOUS

## 2019-10-01 MED ORDER — ROCURONIUM BROMIDE 10 MG/ML (PF) SYRINGE
PREFILLED_SYRINGE | INTRAVENOUS | Status: DC | PRN
  Administered 2019-10-01: 40 mg via INTRAVENOUS
  Administered 2019-10-01: 20 mg via INTRAVENOUS

## 2019-10-01 MED ORDER — HYDROCODONE-ACETAMINOPHEN 5-325 MG PO TABS
1.0000 | ORAL_TABLET | ORAL | Status: DC | PRN
  Administered 2019-10-01 (×2): 1 via ORAL
  Filled 2019-10-01 (×3): qty 1

## 2019-10-01 MED ORDER — HEPARIN SODIUM (PORCINE) 5000 UNIT/ML IJ SOLN
5000.0000 [IU] | Freq: Three times a day (TID) | INTRAMUSCULAR | Status: DC
  Administered 2019-10-03 (×2): 5000 [IU] via SUBCUTANEOUS
  Filled 2019-10-01 (×2): qty 1

## 2019-10-01 MED ORDER — THROMBIN 5000 UNITS EX SOLR: OROMUCOSAL | Status: DC | PRN

## 2019-10-01 MED ORDER — OXYMETAZOLINE HCL 0.05 % NA SOLN
NASAL | Status: AC
  Filled 2019-10-01: qty 30

## 2019-10-01 MED ORDER — ONDANSETRON HCL 4 MG/2ML IJ SOLN: 4.0000 mg | INTRAMUSCULAR | Status: DC | PRN

## 2019-10-01 MED ORDER — ONDANSETRON HCL 4 MG/2ML IJ SOLN
INTRAMUSCULAR | Status: AC
  Filled 2019-10-01: qty 2

## 2019-10-01 SURGICAL SUPPLY — 101 items
ATTRACTOMAT 16X20 MAGNETIC DRP (DRAPES) ×2 IMPLANT
BAG DECANTER FOR FLEXI CONT (MISCELLANEOUS) ×2 IMPLANT
BAND RUBBER #18 3X1/16 STRL (MISCELLANEOUS) IMPLANT
BENZOIN TINCTURE PRP APPL 2/3 (GAUZE/BANDAGES/DRESSINGS) IMPLANT
BLADE RAD40 ROTATE 4M 4 5PK (BLADE) IMPLANT
BLADE ROTATE TRICUT 4X13 M4 (BLADE) ×2 IMPLANT
BLADE SURG 10 STRL SS (BLADE) IMPLANT
BLADE SURG 11 STRL SS (BLADE) ×4 IMPLANT
BLADE SURG 15 STRL LF DISP TIS (BLADE) ×1 IMPLANT
BLADE SURG 15 STRL SS (BLADE) ×1
BUR DIAMOND 13X5 70D (BURR) IMPLANT
BUR DIAMOND CURV 15X5 15D (BURR) IMPLANT
BUR TAPER CHOANAL ATRESIA 30K (BURR) ×2 IMPLANT
CABLE BIPOLOR RESECTION CORD (MISCELLANEOUS) ×2 IMPLANT
CANISTER SUCT 3000ML PPV (MISCELLANEOUS) ×4 IMPLANT
COAGULATOR SUCT 8FR VV (MISCELLANEOUS) ×2 IMPLANT
COVER BACK TABLE 60X90IN (DRAPES) IMPLANT
COVER MAYO STAND STRL (DRAPES) ×2 IMPLANT
COVER WAND RF STERILE (DRAPES) IMPLANT
DRAPE HALF SHEET 40X57 (DRAPES) IMPLANT
DRAPE INCISE IOBAN 66X45 STRL (DRAPES) ×2 IMPLANT
DRAPE MICROSCOPE LEICA (MISCELLANEOUS) IMPLANT
DRAPE SURG 17X23 STRL (DRAPES) IMPLANT
DRESSING NASAL POPE 10X1.5X2.5 (GAUZE/BANDAGES/DRESSINGS) IMPLANT
DRSG NASAL POPE 10X1.5X2.5 (GAUZE/BANDAGES/DRESSINGS)
DRSG NASOPORE 8CM (GAUZE/BANDAGES/DRESSINGS) ×6 IMPLANT
DURAPREP 26ML APPLICATOR (WOUND CARE) ×2 IMPLANT
ELECT COATED BLADE 2.86 ST (ELECTRODE) IMPLANT
ELECT NEEDLE TIP 2.8 STRL (NEEDLE) IMPLANT
ELECT REM PT RETURN 9FT ADLT (ELECTROSURGICAL) ×2
ELECTRODE REM PT RTRN 9FT ADLT (ELECTROSURGICAL) ×1 IMPLANT
GAUZE PACKING FOLDED 2  STR (GAUZE/BANDAGES/DRESSINGS)
GAUZE PACKING FOLDED 2 STR (GAUZE/BANDAGES/DRESSINGS) IMPLANT
GAUZE SPONGE 2X2 8PLY STRL LF (GAUZE/BANDAGES/DRESSINGS) IMPLANT
GAUZE SPONGE 4X4 12PLY STRL (GAUZE/BANDAGES/DRESSINGS) IMPLANT
GLOVE BIO SURGEON STRL SZ7.5 (GLOVE) ×2 IMPLANT
GLOVE BIOGEL M 7.0 STRL (GLOVE) ×4 IMPLANT
GLOVE BIOGEL PI IND STRL 7.5 (GLOVE) ×1 IMPLANT
GLOVE BIOGEL PI IND STRL 8 (GLOVE) ×6 IMPLANT
GLOVE BIOGEL PI INDICATOR 7.5 (GLOVE) ×1
GLOVE BIOGEL PI INDICATOR 8 (GLOVE) ×6
GLOVE ECLIPSE 7.5 STRL STRAW (GLOVE) ×6 IMPLANT
GLOVE EXAM NITRILE LRG STRL (GLOVE) IMPLANT
GLOVE EXAM NITRILE XL STR (GLOVE) IMPLANT
GLOVE EXAM NITRILE XS STR PU (GLOVE) IMPLANT
GOWN STRL REUS W/ TWL LRG LVL3 (GOWN DISPOSABLE) ×3 IMPLANT
GOWN STRL REUS W/ TWL XL LVL3 (GOWN DISPOSABLE) ×1 IMPLANT
GOWN STRL REUS W/TWL 2XL LVL3 (GOWN DISPOSABLE) IMPLANT
GOWN STRL REUS W/TWL LRG LVL3 (GOWN DISPOSABLE) ×3
GOWN STRL REUS W/TWL XL LVL3 (GOWN DISPOSABLE) ×1
HEMOSTAT POWDER KIT SURGIFOAM (HEMOSTASIS) ×2 IMPLANT
HEMOSTAT SURGICEL 2X14 (HEMOSTASIS) ×2 IMPLANT
KIT BASIN OR (CUSTOM PROCEDURE TRAY) ×2 IMPLANT
KIT DRAIN CSF ACCUDRAIN (MISCELLANEOUS) IMPLANT
KIT TURNOVER KIT B (KITS) ×2 IMPLANT
KNIFE ARACHNOID DISP AM-21-S (BLADE) IMPLANT
NEEDLE HYPO 25GX1X1/2 BEV (NEEDLE) IMPLANT
NEEDLE HYPO 25X1 1.5 SAFETY (NEEDLE) ×2 IMPLANT
NEEDLE SPNL 22GX3.5 QUINCKE BK (NEEDLE) ×2 IMPLANT
NEEDLE SPNL 25GX3.5 QUINCKE BL (NEEDLE) ×2 IMPLANT
NS IRRIG 1000ML POUR BTL (IV SOLUTION) ×2 IMPLANT
PAD ARMBOARD 7.5X6 YLW CONV (MISCELLANEOUS) ×6 IMPLANT
PATTIES SURGICAL .25X.25 (GAUZE/BANDAGES/DRESSINGS) IMPLANT
PATTIES SURGICAL .5 X.5 (GAUZE/BANDAGES/DRESSINGS) ×2 IMPLANT
PATTIES SURGICAL .5 X3 (DISPOSABLE) ×2 IMPLANT
PENCIL BUTTON HOLSTER BLD 10FT (ELECTRODE) IMPLANT
PROBE FOR NEUROSURGERY (MISCELLANEOUS) IMPLANT
SEALANT ADHERUS EXTEND TIP (MISCELLANEOUS) ×2 IMPLANT
SHEATH ENDOSCRUB 0 DEG (SHEATH) ×2 IMPLANT
SHEATH ENDOSCRUB 30 DEG (SHEATH) IMPLANT
SHEATH ENDOSCRUB 45 DEG (SHEATH) IMPLANT
SPECIMEN JAR SMALL (MISCELLANEOUS) IMPLANT
SPLINT NASAL DOYLE BI-VL (GAUZE/BANDAGES/DRESSINGS) IMPLANT
SPONGE GAUZE 2X2 STER 10/PKG (GAUZE/BANDAGES/DRESSINGS)
SPONGE LAP 4X18 RFD (DISPOSABLE) IMPLANT
SPONGE NEURO XRAY DETECT 1X3 (DISPOSABLE) ×2 IMPLANT
SPONGE SURGIFOAM ABS GEL SZ50 (HEMOSTASIS) IMPLANT
STAPLER SKIN PROX WIDE 3.9 (STAPLE) IMPLANT
STRIP CLOSURE SKIN 1/2X4 (GAUZE/BANDAGES/DRESSINGS) IMPLANT
SUT 5.0 PDS RB-1 (SUTURE)
SUT BONE WAX W31G (SUTURE) IMPLANT
SUT ETHILON 3 0 FSL (SUTURE) IMPLANT
SUT ETHILON 3 0 PS 1 (SUTURE) IMPLANT
SUT ETHILON 6 0 P 1 (SUTURE) IMPLANT
SUT PDS AB 4-0 RB1 27 (SUTURE) IMPLANT
SUT PDS PLUS AB 5-0 RB-1 (SUTURE) IMPLANT
SUT PLAIN 4 0 ~~LOC~~ 1 (SUTURE) IMPLANT
SUT VIC AB 4-0 P-3 18X BRD (SUTURE) IMPLANT
SUT VIC AB 4-0 P3 18 (SUTURE)
SYR CONTROL 10ML LL (SYRINGE) IMPLANT
TOWEL GREEN STERILE (TOWEL DISPOSABLE) ×2 IMPLANT
TOWEL GREEN STERILE FF (TOWEL DISPOSABLE) ×4 IMPLANT
TRACKER ENT INSTRUMENT (MISCELLANEOUS) ×2 IMPLANT
TRACKER ENT PATIENT (MISCELLANEOUS) ×2 IMPLANT
TRAP SPECIMEN MUCUS 40CC (MISCELLANEOUS) ×2 IMPLANT
TRAY ENT MC OR (CUSTOM PROCEDURE TRAY) ×2 IMPLANT
TRAY FOLEY MTR SLVR 16FR STAT (SET/KITS/TRAYS/PACK) ×2 IMPLANT
TUBE CONNECTING 12X1/4 (SUCTIONS) ×2 IMPLANT
TUBING EXTENTION W/L.L. (IV SETS) ×2 IMPLANT
TUBING STRAIGHTSHOT EPS 5PK (TUBING) ×2 IMPLANT
WATER STERILE IRR 1000ML POUR (IV SOLUTION) IMPLANT

## 2019-10-01 NOTE — Progress Notes (Signed)
Dr Nyoka Cowden and CRNA aware potassium today is 2.9.

## 2019-10-01 NOTE — Anesthesia Postprocedure Evaluation (Signed)
Anesthesia Post Note  Patient: Mckenzie Jennings  Procedure(s) Performed: Endoscopic endonasal transsphenoidal resection of pituitary tumor (N/A Nose) TRANSPHENOIDAL APPROACH EXPOSURE (N/A Nose)     Patient location during evaluation: PACU Anesthesia Type: General Level of consciousness: awake Pain management: pain level controlled Vital Signs Assessment: post-procedure vital signs reviewed and stable Respiratory status: spontaneous breathing Cardiovascular status: stable Postop Assessment: no apparent nausea or vomiting Anesthetic complications: no   No complications documented.  Last Vitals:  Vitals:   10/01/19 1315 10/01/19 1330  BP: (!) 147/89 136/80  Pulse: 87 89  Resp: 17 17  Temp:    SpO2: 100% 99%    Last Pain:  Vitals:   10/01/19 1340  TempSrc:   PainSc: 6                  Kamsiyochukwu Buist

## 2019-10-01 NOTE — Anesthesia Procedure Notes (Signed)
Arterial Line Insertion Start/End6/01/2020 7:00 AM Performed by: Junie Bame, RN, CRNA  Patient location: Pre-op. Preanesthetic checklist: patient identified, IV checked, site marked, risks and benefits discussed, surgical consent, monitors and equipment checked, pre-op evaluation, timeout performed and anesthesia consent Lidocaine 1% used for infiltration Right, radial was placed Catheter size: 20 G Hand hygiene performed  and maximum sterile barriers used   Attempts: 2 Procedure performed without using ultrasound guided technique. Following insertion, Biopatch and dressing applied. Post procedure assessment: normal  Patient tolerated the procedure well with no immediate complications.

## 2019-10-01 NOTE — Transfer of Care (Signed)
Immediate Anesthesia Transfer of Care Note  Patient: Mckenzie Jennings  Procedure(s) Performed: Endoscopic endonasal transsphenoidal resection of pituitary tumor (N/A Nose) TRANSPHENOIDAL APPROACH EXPOSURE (N/A Nose)  Patient Location: PACU  Anesthesia Type:General  Level of Consciousness: awake and drowsy  Airway & Oxygen Therapy: Patient Spontanous Breathing and Patient connected to face mask oxygen  Post-op Assessment: Report given to RN and Post -op Vital signs reviewed and stable  Post vital signs: Reviewed and stable  Last Vitals:  Vitals Value Taken Time  BP    Temp    Pulse 75 10/01/19 1050  Resp 16 10/01/19 1050  SpO2 100 % 10/01/19 1050  Vitals shown include unvalidated device data.  Last Pain:  Vitals:   10/01/19 0627  TempSrc: Oral  PainSc:       Patients Stated Pain Goal: 3 (97/35/32 9924)  Complications: No complications documented.

## 2019-10-01 NOTE — Op Note (Signed)
Operative Note:  ENDOSCOPIC TRANSSPHENOIDAL PITUITARY RESECTION WITH NAVIGATION      Patient: Mckenzie Jennings  Medical record number: [id number]  Date:10/01/2019  Pre-operative Indications: 1.  Pituitary Mass       Postoperative Indications: Same  Surgical Procedure: 1. Endoscopic transsphenoidal pituitary resection with intraoperative navigation    2.  Bilateral endoscopic sphenoidotomy with removal of tissue      Anesthesia: GET  Surgeon: Delsa Bern, M.D.  Neurosurgeon: Dr. Zada Finders  Complications: None  EBL: 100 cc  Findings: Normal sinonasal anatomy.  Tumor resected without evidence of significant bleeding or spinal fluid leak. Naso-pore sphenoid packing placed.  Note: The neurosurgical component of the operative procedure is dictated as a separate operative note.   Brief History: The patient is a 49 y.o. female with a history of pituitary mass. The patient has a history of visual change.  Work-up including MRI scan showed large pituitary mass extending superiorly and affecting the optic chiasm. The patient was referred to Dr. Zada Finders for neurosurgical evaluation.  Patient seen by me at [facility] preoperatively with review of nasal anatomy and sinus CT scan for navigation.  Given the patient's history and findings, the above surgical procedures were recommended, risks and benefits were discussed in detail with the patient.  They understand and agree with our plan for surgery which is scheduled at [facility] under general anesthesia.  Surgical Procedure: The patient is brought to the neurosurgical operating room on 10/01/2019 and placed in supine position on the operating table. General endotracheal anesthesia was established without difficulty. When the patient was adequately anesthetized, surgical timeout was performed with correct identification of the patient and the surgical procedure. The patient's nose was then injected with 9 cc of 1% lidocaine  1:100,000 dilution epinephrine which was injected in a submucosal fashion. The patient's nose was then packed with Afrin-soaked cottonoid pledgets were left in place for approximately 10 minutes to allow for vasoconstriction and hemostasis.  The Xomed Fusion navigation headgear was applied and anatomic and surgical landmarks were identified and confirmed, navigation was used throughout the sinus component of the surgical procedure.  With the patient prepped draped and prepared for surgery, nasal endoscopy was performed on the patient's right.  The middle turbinate was carefully lateralized to allow access to the posterior aspect of the nasal passageway.  The right sphenoid sinus ostium was identified.  The inferior aspect of the superior turbinate was then resected with through-cutting forceps and a microdebrider.  The right sphenoid sinus ostium was enlarged in a superior and lateral direction using the microdebrider and through-cutting forceps to create a widely patent ostium.  Nasal endoscopy on the patient's left-hand side was then undertaken.  The left middle turbinate was lateralized and the posterior nasal cavity was visualized with identification of the left sphenoid sinus ostium using navigation.  The inferior aspect of the superior turbinate was resected and the sinus ostium was enlarged in the lateral and superior direction to create a wide sphenoid sinus ostium.  A posterior septectomy was then performed with a Surveyor, quantity.  Bone, cartilage and soft tissue was then resected to create a wide posterior septotomy.  The anterior face of the sphenoid sinus and sphenoid sinus septum were then resected with a combination of through-cutting forceps, osteotome and microdebrider to allow direct access to the entire posterior aspect of the sphenoid sinus and pituitary fossa.  Sphenoid sinus mucosa overlying the pituitary fossa was elevated and lateralized.  The anterior face of the pituitary fossa  was  demarcated using navigation.  With adequate access to the pituitary fossa the neurosurgical component of the procedure was begun by Dr. Zada Finders.  This is dictated as a separate operative report.  Resection of the pituitary tumor was undertaken using direct visualization of the 0 degree endoscope, navigation and blunt and sharp dissection.  With pituitary tumor resection completed, reconstruction was undertaken.  There was no evidence of spinal fluid leak or significant bleeding and the anterior pituitary defect was closed primarily with Adheris neurosurgical glue.  Surgicel was placed over the pituitary fossa defect and the sphenoid sinus was loosely packed with Naso-pore absorbable nasal packing.  There was no active bleeding and no evidence of spinal fluid leak.  The sphenoid sinus was carefully inspected, no further bleeding along the mucosal margins, sphenoidotomy sites or posterior septectomy.  The patient's nasal cavity was irrigated and suctioned.  Surgical sponge count was correct. An oral gastric tube was passed and the stomach contents were aspirated. Patient was awakened from anesthetic and transferred from the operating room to the recovery room in stable condition. There were no complications and blood loss was 100 cc.   Delsa Bern, M.D. [facility] 10/01/2019

## 2019-10-01 NOTE — Op Note (Signed)
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 The patient was found to have a non-functioning pituitary adenoma. This was discussed with the patient as well as risks, benefits, and alternatives and wished to proceed with surgical treatment.   OPERATIVE DETAIL: Dr. Madaline Guthrie performed the nasal portion of the approach and is dictated separately.   For the neurosurgical portion of the case, Dr. Wilburn Cornelia and I used a combination of visual anatomic landmarks and frameless stereotaxy to confirm orientation and anatomy. The dura of the sella was then opened sharply with a #11 blade in a cruciate fashion. Tumor was immediately encountered and samples were taken and sent to pathology for analysis. With Dr. Victorio Palm assistance while holding the endoscope, the tumor was then resected in a deliberate fashion with bimanual technique using a combination of ring curettes and suction. It was dissected centrally, posteriorly, then laterally and ventrally. After aspirating a significant amount of soft / liquid tumor, the diaphragma descended into the field. There was no evidence of CSF leakage. Hemostasis was confirmed and Adheris ([employer]) was used to fill the sella. Dr. Wilburn Cornelia then took over to complete the skull base reconstruction portion of the procedure.   EBL:  43mL   DRAINS: none   SPECIMENS: Pituitary tumor   Judith Part, MD 10/01/19 7:59 AM

## 2019-10-01 NOTE — Anesthesia Procedure Notes (Signed)
Procedure Name: Intubation Date/Time: 10/01/2019 8:04 AM Performed by: Trinna Post., CRNA Pre-anesthesia Checklist: Patient identified, Emergency Drugs available, Suction available and Patient being monitored Patient Re-evaluated:Patient Re-evaluated prior to induction Oxygen Delivery Method: Circle system utilized Preoxygenation: Pre-oxygenation with 100% oxygen Induction Type: IV induction Ventilation: Mask ventilation without difficulty and Oral airway inserted - appropriate to patient size Laryngoscope Size: Glidescope and 3 Grade View: Grade I Tube type: Oral Tube size: 7.0 mm Number of attempts: 2 (atempted with DVL MAC 4) Airway Equipment and Method: Stylet Placement Confirmation: ETT inserted through vocal cords under direct vision,  positive ETCO2 and breath sounds checked- equal and bilateral Secured at: 22 cm Tube secured with: Tape Dental Injury: Teeth and Oropharynx as per pre-operative assessment

## 2019-10-01 NOTE — H&P (Addendum)
Surgical H&P Update  HPI: 49 y.o. woman with progressive visual acuity loss, diagnosed with bitemporal hemianopsia, MRI showed large pituitary adenoma, labs consistent with non-functioning tumor. No changes in health since she was last seen. Still having visual abnormalities and wishes to proceed with surgery.  PMHx:  Past Medical History:  Diagnosis Date  . Hypertension    FamHx: History reviewed. No pertinent family history. SocHx:  reports that she has never smoked. She has never used smokeless tobacco. She reports current alcohol use of about 1.0 standard drink of alcohol per week. She reports that she does not use drugs.  Physical Exam: AOx3, PERRL, FS, TM, visual fields with bitemporal hemianopsia on confrontation  Strength 5/5 x4, SILTx4  Assesment/Plan: 49 y.o. woman with pituitary adenoma and bitemporal hemianopsia, here for endoscopic endonasal resection. Risks, benefits, and alternatives discussed and the patient would like to continue with surgery.  -OR today -post-op: 4N ICU with pituitary labs / protocol. Preop labs w/ hypokalemia, will hold HCTZ in case she has high UOP to prevent additional K/Na wasting, continue home ARB  Judith Part, MD 10/01/19 7:28 AM

## 2019-10-01 NOTE — Progress Notes (Signed)
° °  ENT Progress Note:  Procedure(s): Endoscopic endonasal transsphenoidal resection of pituitary tumor TRANSPHENOIDAL APPROACH EXPOSURE   Subjective: Stable preop  Objective: Vital signs in last 24 hours: Temp:  [98 F (36.7 C)] 98 F (36.7 C) (06/10 0627) Pulse Rate:  [71] 71 (06/10 0627) Resp:  [17] 17 (06/10 0627) BP: (128)/(90) 128/90 (06/10 0627) SpO2:  [99 %] 99 % (06/10 0627) Weight:  [96.6 kg-96.8 kg] 96.6 kg (06/10 0627) Weight change:     Intake/Output from previous day: No intake/output data recorded. Intake/Output this shift: No intake/output data recorded.  Labs: Recent Labs    09/28/19 1342 10/01/19 0615  WBC 5.8  --   HGB 11.1* 10.5*  HCT 35.2* 31.0*  PLT 375  --    Recent Labs    09/28/19 1342 10/01/19 0615  NA 139 141  K 2.9* 2.9*  CL 103 100  CO2 26  --   GLUCOSE 91 110*  BUN 8 13  CALCIUM 8.6*  --     Studies/Results: No results found.   PHYSICAL EXAM: No change, vision stable   Assessment/Plan: Adm for EndoscopicTrans-sphenoidal Pituitary Resection with Navigation with Dr. Olena Heckle 10/01/2019, 7:35 AM

## 2019-10-02 ENCOUNTER — Encounter (HOSPITAL_COMMUNITY): Payer: Self-pay | Admitting: Neurological Surgery

## 2019-10-02 LAB — RENAL FUNCTION PANEL
Albumin: 3.3 g/dL — ABNORMAL LOW (ref 3.5–5.0)
Anion gap: 11 (ref 5–15)
BUN: 7 mg/dL (ref 6–20)
CO2: 27 mmol/L (ref 22–32)
Calcium: 8.8 mg/dL — ABNORMAL LOW (ref 8.9–10.3)
Chloride: 103 mmol/L (ref 98–111)
Creatinine, Ser: 0.8 mg/dL (ref 0.44–1.00)
GFR calc Af Amer: >60 mL/min (ref >60)
GFR calc non Af Amer: >60 mL/min (ref >60)
Glucose, Bld: 131 mg/dL — ABNORMAL HIGH (ref 70–99)
Phosphorus: 2.6 mg/dL (ref 2.5–4.6)
Potassium: 3.2 mmol/L — ABNORMAL LOW (ref 3.5–5.1)
Sodium: 141 mmol/L (ref 135–145)

## 2019-10-02 LAB — SURGICAL PATHOLOGY

## 2019-10-02 MED ORDER — OXYCODONE HCL 5 MG PO TABS
5.0000 mg | ORAL_TABLET | ORAL | Status: DC | PRN
  Administered 2019-10-02: 5 mg via ORAL
  Filled 2019-10-02: qty 1

## 2019-10-02 MED ORDER — OXYCODONE HCL 5 MG PO TABS
10.0000 mg | ORAL_TABLET | ORAL | Status: DC | PRN
  Administered 2019-10-02 (×2): 10 mg via ORAL
  Filled 2019-10-02 (×2): qty 2

## 2019-10-02 MED ORDER — POTASSIUM CHLORIDE CRYS ER 20 MEQ PO TBCR
40.0000 meq | EXTENDED_RELEASE_TABLET | Freq: Once | ORAL | Status: AC
  Administered 2019-10-02: 40 meq via ORAL
  Filled 2019-10-02: qty 2

## 2019-10-02 NOTE — Discharge Instructions (Addendum)
Discharge Instructions  Drink plenty of water when thirsty and always have access to water. Call the office if worsening headaches, n/v, or feeling ill  Prescriptions for colace and vicodin have been sent to your local pharmacy.  Follow up with Dr. Zada Finders in 2 weeks after discharge. If you do not already have a discharge appointment, please call his office at [phone] to schedule a follow up appointment. If you have any concerns or questions, please call the office and let us know.  Sinus/Nasal Instructions:  1. Limited activity 2. Liquid and soft diet 3. May bathe and shower 4. Saline nasal spray - 4 puffs/nostril every hour while awake, begin the morning after surgery 5. Elevate Head of Bed 6. No nose blowing/Open mouth sneeze 7. Alternate Tylenol and ibuprofen every 6 hours as needed for pain.  Call [facility] for any questions or concerns regarding nasal issues: [phone]

## 2019-10-02 NOTE — Progress Notes (Signed)
Neurosurgery Service Progress Note  Subjective: No acute events overnight, headaches and nasal fullness without nausea / vomiting, no drainage from nares, no episodes of significant polyuria   Objective: Vitals:   10/02/19 0300 10/02/19 0400 10/02/19 0500 10/02/19 0700  BP: 136/77 129/77 131/76 134/68  Pulse: 69 72 64 71  Resp: 12 18 19  (!) 21  Temp:  98.7 F (37.1 C)    TempSrc:  Oral    SpO2: 99% 99% 100% 96%  Weight:      Height:       Temp (24hrs), Avg:98.1 F (36.7 C), Min:97.1 F (36.2 C), Max:98.7 F (37.1 C)  CBC Latest Ref Rng & Units 10/01/2019 10/01/2019 09/28/2019  WBC 4.0 - 10.5 K/uL 9.0 - 5.8  Hemoglobin 12.0 - 15.0 g/dL 9.2(L) 10.5(L) 11.1(L)  Hematocrit 36 - 46 % 28.8(L) 31.0(L) 35.2(L)  Platelets 150 - 400 K/uL 354 - 375   BMP Latest Ref Rng & Units 10/01/2019 10/01/2019 09/28/2019  Glucose 70 - 99 mg/dL - 110(H) 91  BUN 6 - 20 mg/dL - 13 8  Creatinine 0.44 - 1.00 mg/dL 0.32(L) 0.80 0.71  Sodium 135 - 145 mmol/L - 141 139  Potassium 3.5 - 5.1 mmol/L - 2.9(L) 2.9(L)  Chloride 98 - 111 mmol/L - 100 103  CO2 22 - 32 mmol/L - - 26  Calcium 8.9 - 10.3 mg/dL - - 8.6(L)    Intake/Output Summary (Last 24 hours) at 10/02/2019 0800 Last data filed at 10/02/2019 0700 Gross per 24 hour  Intake 1477.26 ml  Output 2255 ml  Net -777.74 ml    Current Facility-Administered Medications:  .  0.9 %  sodium chloride infusion, , Intravenous, PRN, Judith Part, MD, Last Rate: 10 mL/hr at 10/02/19 0700, Rate Verify at 10/02/19 0700 .  acetaminophen (TYLENOL) tablet 650 mg, 650 mg, Oral, Q4H PRN, 650 mg at 10/01/19 1301 **OR** acetaminophen (TYLENOL) suppository 650 mg, 650 mg, Rectal, Q4H PRN, Khaled Herda A, MD .  amLODipine (NORVASC) tablet 10 mg, 10 mg, Oral, Daily, Chantal Worthey A, MD .  cephALEXin (KEFLEX) capsule 500 mg, 500 mg, Oral, Q8H, Jerrell Belfast, MD, 500 mg at 10/02/19 0547 .  Chlorhexidine Gluconate Cloth 2 % PADS 6 each, 6 each, Topical, Daily,  Judith Part, MD, 6 each at 10/01/19 1211 .  docusate sodium (COLACE) capsule 100 mg, 100 mg, Oral, BID, Judith Part, MD, 100 mg at 10/01/19 2133 .  famotidine (PEPCID) IVPB 20 mg premix, 20 mg, Intravenous, Q12H, Judith Part, MD, Stopped at 10/01/19 2204 .  [START ON 10/03/2019] heparin injection 5,000 Units, 5,000 Units, Subcutaneous, Q8H, Fae Blossom A, MD .  HYDROcodone-acetaminophen (NORCO/VICODIN) 5-325 MG per tablet 1 tablet, 1 tablet, Oral, Q4H PRN, Judith Part, MD, 1 tablet at 10/01/19 2000 .  HYDROmorphone (DILAUDID) injection 0.5 mg, 0.5 mg, Intravenous, Q3H PRN, Judith Part, MD, 0.5 mg at 10/01/19 2310 .  labetalol (NORMODYNE) injection 10-40 mg, 10-40 mg, Intravenous, Q10 min PRN, Judith Part, MD .  losartan (COZAAR) tablet 100 mg, 100 mg, Oral, Daily, Jacey Pelc, Joyice Faster, MD .  MEDLINE mouth rinse, 15 mL, Mouth Rinse, BID, Xaviera Flaten, Joyice Faster, MD, 15 mL at 10/01/19 2135 .  ondansetron (ZOFRAN) tablet 4 mg, 4 mg, Oral, Q4H PRN **OR** ondansetron (ZOFRAN) injection 4 mg, 4 mg, Intravenous, Q4H PRN, Jazlyn Tippens A, MD .  polyethylene glycol (MIRALAX / GLYCOLAX) packet 17 g, 17 g, Oral, Daily PRN, Judith Part, MD .  potassium chloride SA (  KLOR-CON) CR tablet 40 mEq, 40 mEq, Oral, Once, Hortencia Martire, Joyice Faster, MD .  promethazine (PHENERGAN) tablet 12.5-25 mg, 12.5-25 mg, Oral, Q4H PRN, Judith Part, MD .  sodium chloride (OCEAN) 0.65 % nasal spray 4 spray, 4 spray, Each Nare, Q1H PRN, Jerrell Belfast, MD   Physical Exam: AOx3, PERRL, EOMI, FS, Strength 5/5 x4, SILTx4, no drift Visual fields subjectively stable from preop per pt, unreliable confrontational visual fields this morning  Assessment & Plan: 49 y.o. woman s/p endonasal endoscopic resection of pituitary adenoma, recovering well.  -transfer to stepdown -change hydrocodone to oxycodone -19mEq K po this morning, f/u [facility] -SCDs/TEDs  Judith Part   10/02/19 8:00 AM

## 2019-10-02 NOTE — Discharge Summary (Signed)
Discharge Summary  Date of Admission: 10/01/2019  Date of Discharge: 10/03/19  Attending Physician: Emelda Brothers, MD  Hospital Course: Patient was admitted following an uncomplicated endonasal endoscopic resection of a pituitary adenoma. She was recovered in PACU and transferred to 4N. Her hospital course was uncomplicated and the patient was discharged home on 10/03/19. She will follow up in clinic with me in 2 weeks.  Neurologic exam at discharge:  AOx3, PERRL, EOMI, FS, TM, VF w/ bitemporal hemianopsia Strength 5/5 x4, SILTx4, no drift  Discharge diagnosis: Pituitary adenoma

## 2019-10-02 NOTE — Evaluation (Signed)
Physical Therapy Evaluation Patient Details Name: Mckenzie Jennings MRN: [id number] DOB: 1971/03/30 Today's Date: 10/02/2019   History of Present Illness  49 y.o. woman with progressive visual acuity loss, diagnosed with bitemporal hemianopsia, MRI showed large pituitary adenoma, labs consistent with non-functioning tumor. Pt underwent Endonasal endoscopic transphenoidal resection of pituitary tumor on 6/10.  Clinical Impression  Pt presents to PT with deficits in vision and balance with mild gait deviations. Pt reports blurred vision, needing to take time to focus on reading and unable to read small print at this time. Pt with slowed gait compared to baseline but able to gradually increase gait speed with further ambulation distances. Pt's activity tolerance remains limited somewhat due to headache. Pt will continue to benefit from acute PT in an attempt to aide in a return to baseline gait and balance quality, providing higher level balance and gait challenges at next session. PT anticipates no PT or DME needs at time of discharge.    Follow Up Recommendations No PT follow up    Equipment Recommendations  None recommended by PT    Recommendations for Other Services       Precautions / Restrictions Precautions Precautions: Fall Precaution Comments: vision deficits Restrictions Weight Bearing Restrictions: No      Mobility  Bed Mobility               General bed mobility comments: pt received and left in bed  Transfers Overall transfer level: Needs assistance Equipment used: None Transfers: Sit to/from Stand Sit to Stand: Supervision            Ambulation/Gait Ambulation/Gait assistance: Supervision Gait Distance (Feet): 300 Feet Assistive device: None Gait Pattern/deviations: Step-through pattern Gait velocity: functional Gait velocity interpretation: >2.62 ft/sec, indicative of community ambulatory General Gait Details: gradually increasing gait speed with  increased ambulation distance, pt steady gait with no significant balance deviations noted  Stairs Stairs:  (pt declines need for stair assessment at this time)          Wheelchair Mobility    Modified Rankin (Stroke Patients Only)       Balance Overall balance assessment: Mild deficits observed, not formally tested                                           Pertinent Vitals/Pain Pain Assessment: 0-10 Pain Score: 4  Pain Location: head Pain Descriptors / Indicators: Aching Pain Intervention(s): Monitored during session    Home Living Family/patient expects to be discharged to:: Private residence Living Arrangements: Spouse/significant other Available Help at Discharge: Family;Available 24 hours/day Type of Home: House Home Access: Stairs to enter Entrance Stairs-Rails: Right Entrance Stairs-Number of Steps: 6 Home Layout: Two level;Able to live on main level with bedroom/bathroom Home Equipment: None      Prior Function Level of Independence: Independent         Comments: works as an Development worker, international aid at a [employer]        Extremity/Trunk Assessment   Upper Extremity Assessment Upper Extremity Assessment: Overall WFL for tasks assessed    Lower Extremity Assessment Lower Extremity Assessment: Overall WFL for tasks assessed    Cervical / Trunk Assessment Cervical / Trunk Assessment: Normal  Communication   Communication: No difficulties  Cognition Arousal/Alertness: Awake/alert Behavior During Therapy: WFL for tasks assessed/performed Overall Cognitive Status: Within Functional Limits for tasks assessed  General Comments General comments (skin integrity, edema, etc.): VSS on RA    Exercises     Assessment/Plan    PT Assessment Patient needs continued PT services  PT Problem List Decreased balance;Decreased activity tolerance;Other (comment)  (vision)       PT Treatment Interventions Gait training;Stair training;Balance training;Neuromuscular re-education;Patient/family education    PT Goals (Current goals can be found in the Care Plan section)  Acute Rehab PT Goals Patient Stated Goal: To return to work PT Goal Formulation: With patient Time For Goal Achievement: 10/16/19 Potential to Achieve Goals: Good Additional Goals Additional Goal #1: Pt will socre 20/24 or greater on DGI to indicate a low falls risk.    Frequency Min 4X/week   Barriers to discharge        Co-evaluation               AM-PAC PT "6 Clicks" Mobility  Outcome Measure Help needed turning from your back to your side while in a flat bed without using bedrails?: None Help needed moving from lying on your back to sitting on the side of a flat bed without using bedrails?: None Help needed moving to and from a bed to a chair (including a wheelchair)?: None Help needed standing up from a chair using your arms (e.g., wheelchair or bedside chair)?: None Help needed to walk in hospital room?: None Help needed climbing 3-5 steps with a railing? : None 6 Click Score: 24    End of Session   Activity Tolerance: Patient tolerated treatment well Patient left: in chair;with call bell/phone within reach Nurse Communication: Mobility status PT Visit Diagnosis: Other symptoms and signs involving the nervous system (R29.898);Other abnormalities of gait and mobility (R26.89)    Time: 3729-0211 PT Time Calculation (min) (ACUTE ONLY): 15 min   Charges:   PT Evaluation $PT Eval Moderate Complexity: 1 Mod          Zenaida Niece, PT, DPT Acute Rehabilitation Pager: [phone]   Zenaida Niece 10/02/2019, 4:14 PM

## 2019-10-03 MED ORDER — SALINE SPRAY 0.65 % NA SOLN: 4.0000 | NASAL | Status: DC | PRN

## 2019-10-03 MED ORDER — FAMOTIDINE 20 MG PO TABS
20.0000 mg | ORAL_TABLET | Freq: Two times a day (BID) | ORAL | Status: DC
  Administered 2019-10-03: 20 mg via ORAL
  Filled 2019-10-03: qty 1

## 2019-10-03 NOTE — Plan of Care (Signed)
Education and care plan complete, adequate for discharge

## 2019-10-03 NOTE — Progress Notes (Signed)
Both PIVs have been removed. Pt has her cell phone and belongings. All discharge instructions have been reviewed. Pt is dressed and her son is at bedside, husband is waiting downstairs for the pt to be wheeled down to the main entrance

## 2019-10-03 NOTE — Progress Notes (Signed)
Discharge instructions have been reviewed with patient and all questions answered at this time. Pt understands that she will have a follow-up visit with Dr. Wilburn Cornelia and Dr. Zada Finders within the next 2 weeks. Pt will pick up prescription medication [facility] and will continue to take her Keflex medication 3 times a day for the next 7 days, Pt has one scheduled dose remaining for today and will take the Keflex before bedtime this evening. Pt has worked with OT today and has ambulated in the room multiple times as well without difficulty. All pt belongings have been returned. Pt is waiting for her husband to arrive at the bedside as he will take her home. PIVs will be removed once her husband arrives. Pt VSS and all care plan and pt education have been completed.

## 2019-10-03 NOTE — Evaluation (Signed)
Occupational Therapy Evaluation Patient Details Name: Mckenzie Jennings MRN: [id number] DOB: 10-20-1970 Today's Date: 10/03/2019    History of Present Illness 49 y.o. woman with progressive visual acuity loss, diagnosed with bitemporal hemianopsia, MRI showed large pituitary adenoma, labs consistent with non-functioning tumor. Pt underwent Endonasal endoscopic transphenoidal resection of pituitary tumor on 6/10.   Clinical Impression   Patient evaluated by Occupational Therapy with no further acute OT needs identified. All education has been completed and the patient has no further questions. Pt demonstrates bitemporal field deficits as well as impaired visual acuity, and intermittent diplopia.  Pt instructed in compensatory strategies.  She is hoping vision will improve post surgically.  Will defer formal follow up OT at this time, but if vision has not improved at follow up, recommend OPOT.  Recommend no driving.  See below for any follow-up Occupational Therapy or equipment needs. OT is signing off. Thank you for this referral.      Follow Up Recommendations  No OT follow up (may benefit from OPOT at f/u if vision not improved )    Equipment Recommendations  None recommended by OT    Recommendations for Other Services       Precautions / Restrictions Precautions Precautions: Fall Precaution Comments: vision deficits      Mobility Bed Mobility Overal bed mobility: Independent                Transfers       Sit to Stand: Supervision              Balance                                           ADL either performed or assessed with clinical judgement   ADL Overall ADL's : Needs assistance/impaired Eating/Feeding: Modified independent;Sitting;Bed level   Grooming: Wash/dry hands;Wash/dry face;Oral care;Brushing hair;Supervision/safety;Standing   Upper Body Bathing: Set up;Sitting   Lower Body Bathing: Supervison/ safety;Sit to/from stand    Upper Body Dressing : Set up;Sitting   Lower Body Dressing: Supervision/safety;Sit to/from stand   Toilet Transfer: Supervision/safety;Ambulation;Comfort height toilet   Toileting- Clothing Manipulation and Hygiene: Supervision/safety;Sit to/from stand       Functional mobility during ADLs: Supervision/safety General ADL Comments: Pt requires supervision due to visual deficits      Vision Baseline Vision/History: Wears glasses Wears Glasses: At all times Patient Visual Report: Peripheral vision impairment Vision Assessment?: Yes Eye Alignment: Within Functional Limits Ocular Range of Motion: Within Functional Limits Alignment/Gaze Preference: Gaze right;Head tilt;Head turned Tracking/Visual Pursuits: Able to track stimulus in all quads without difficulty Visual Fields: Other (comment) Additional Comments: when reading, pt indicates not being able to see the words in the temporal fields of both eyes.  However, with confrontation testing, she was able to detect stimuli in all quadrants.  She reports her vision feels as if she is looking through parchment paper. She reports intermittent vertical diplopia, and eye fatigue.  With effort, she can read medicine bottle and text messages with her glasses on      Perception Perception Perception Tested?: Yes   Praxis Praxis Praxis tested?: Within functional limits    Pertinent Vitals/Pain Pain Assessment: Faces Faces Pain Scale: Hurts little more Pain Location: head Pain Descriptors / Indicators: Pressure Pain Intervention(s): Premedicated before session     Hand Dominance     Extremity/Trunk Assessment Upper Extremity Assessment Upper Extremity  Assessment: Overall WFL for tasks assessed   Lower Extremity Assessment Lower Extremity Assessment: Overall WFL for tasks assessed   Cervical / Trunk Assessment Cervical / Trunk Assessment: Normal   Communication Communication Communication: No difficulties   Cognition  Arousal/Alertness: Awake/alert Behavior During Therapy: WFL for tasks assessed/performed Overall Cognitive Status: Within Functional Limits for tasks assessed                                     General Comments  Pt instructed to have direct supervision when ambulating outside, and to have direct supervision with medication and financial management.  Discussed compensatory techniques such as marking medication bottles with different colors, using rubber bands to delineate medication.  She verbalized understanding.  Attempted partial occlusion of leses to improve diplopia, however, the occlusion limited her ability to read info.   She plans to follow up with MD and ophthalmologist.   Will defer formal follow up OT to allow time for spontaneous recovery of vision.      Exercises Exercises: Other exercises Other Exercises Other Exercises: Pt instructed in visual tracking exercises and exercises to work on binocular fusion    Shoulder Instructions      Home Living Family/patient expects to be discharged to:: Private residence Living Arrangements: Spouse/significant other Available Help at Discharge: Family;Available 24 hours/day Type of Home: House Home Access: Stairs to enter [employer] of Steps: 6 Entrance Stairs-Rails: Right Home Layout: Two level;Able to live on main level with bedroom/bathroom     Bathroom Shower/Tub: Occupational psychologist: Standard     Home Equipment: None          Prior Functioning/Environment Level of Independence: Independent        Comments: works as an Development worker, 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  Drove to and From [place] daily for work         OT Problem List: Impaired vision/perception;Pain      OT Treatment/Interventions:      OT Goals(Current goals can be found in the care plan section) Acute Rehab OT Goals Patient Stated Goal: to be able to see better  OT Goal Formulation: All assessment and education complete,  DC therapy  OT Frequency:     Barriers to D/C:            Co-evaluation              AM-PAC OT "6 Clicks" Daily Activity     Outcome Measure Help from another person eating meals?: None Help from another person taking care of personal grooming?: A Little Help from another person toileting, which includes using toliet, bedpan, or urinal?: A Little Help from another person bathing (including washing, rinsing, drying)?: A Little Help from another person to put on and taking off regular upper body clothing?: A Little Help from another person to put on and taking off regular lower body clothing?: A Little 6 Click Score: 19   End of Session    Activity Tolerance: Patient tolerated treatment well Patient left: in bed;with call bell/phone within reach  OT Visit Diagnosis: Low vision, both eyes (H54.2)                Time: 0017-4944 OT Time Calculation (min): 34 min Charges:  OT General Charges $OT Visit: 1 Visit OT Evaluation $OT Eval Moderate Complexity: 1 Mod OT Treatments $Self Care/Home Management : 8-22 mins  Renell Allum C., OTR/L Acute  Rehabilitation Services Pager [phone] Office [phone]   Lucille Passy M 10/03/2019, 1:19 PM

## 2019-10-03 NOTE — Progress Notes (Signed)
RN assisted to administer nasal spray this morning. Will continue to administer q1 hour throughout the day

## 2019-10-03 NOTE — Progress Notes (Signed)
   ENT Progress Note: POD #2 s/p Procedure(s): Endoscopic endonasal transsphenoidal resection of pituitary tumor TRANSPHENOIDAL APPROACH EXPOSURE   Subjective: Patient complaining of some nasal congestion, tolerating oral intake without nausea or vomiting  Objective: Vital signs in last 24 hours: Temp:  [97.9 F (36.6 C)-98.8 F (37.1 C)] 98.6 F (37 C) (06/12 0800) Pulse Rate:  [65-94] 90 (06/12 0600) Resp:  [13-22] 17 (06/12 0600) BP: (115-159)/(66-109) 115/81 (06/12 0600) SpO2:  [91 %-98 %] 97 % (06/12 0600) Weight change:     Intake/Output from previous day: 06/11 0701 - 06/12 0700 In: 116 [I.V.:16; IV Piggyback:100] Out: 175 [Urine:175] Intake/Output this shift: No intake/output data recorded.  Labs: Recent Labs    10/01/19 0615 10/01/19 1256  WBC  --  9.0  HGB 10.5* 9.2*  HCT 31.0* 28.8*  PLT  --  354   Recent Labs    10/01/19 0615 10/02/19 0819  NA 141 141  K 2.9* 3.2*  CL 100 103  CO2  --  27  GLUCOSE 110* 131*  BUN 13 7  CALCIUM  --  8.8*    Studies/Results: No results found.   PHYSICAL EXAM: Patient neurologically intact, awake and alert. Normal extraocular mobility, no acute change in vision. No nasal discharge   Assessment/Plan: Patient stable after endoscopic transsphenoidal pituitary resection with Dr. Zada Finders.  Plan discharge to stepdown.  Monitor for any neurologic change, continue frequent nasal saline spray and oral antibiotics.  Plan follow-up [facility] in approximately 2 weeks for postoperative recheck or sooner as needed.    Jerrell Belfast 10/03/2019, 8:52 AM

## 2019-10-30 ENCOUNTER — Other Ambulatory Visit: Payer: Self-pay | Admitting: Neurological Surgery

## 2019-10-30 ENCOUNTER — Other Ambulatory Visit (HOSPITAL_COMMUNITY): Payer: Self-pay | Admitting: Neurological Surgery

## 2019-10-30 DIAGNOSIS — D497 Neoplasm of unspecified behavior of endocrine glands and other parts of nervous system: Secondary | ICD-10-CM

## 2019-11-23 ENCOUNTER — Other Ambulatory Visit: Payer: Self-pay

## 2019-11-23 ENCOUNTER — Ambulatory Visit (HOSPITAL_COMMUNITY)
Admission: RE | Admit: 2019-11-23 | Discharge: 2019-11-23 | Disposition: A | Discharge status: Home / Self Care | Payer: PRIVATE HEALTH INSURANCE | Source: Ambulatory Visit | Attending: Neurological Surgery | Admitting: Neurological Surgery

## 2019-11-23 DIAGNOSIS — D497 Neoplasm of unspecified behavior of endocrine glands and other parts of nervous system: Secondary | ICD-10-CM

## 2019-11-23 IMAGING — MR MR HEAD WO/W CM
9 of 10 series · 35 of 48 positions shown · IV contrast (Yes gad)
Comparison: MR head [DATE].

CLINICAL DATA: Pituitary tumor. Status post transsphenoidal
resection.

EXAM:
MRI HEAD WITHOUT AND WITH CONTRAST
TECHNIQUE: Multiplanar, multiecho pulse sequences of the brain and surrounding
structures were obtained without and with intravenous contrast.
CONTRAST:  9.6mL GADAVIST GADOBUTROL 1 MMOL/ML IV SOLN

[Series 2: DWI · axial · 3.0mm · 0.94mm/px · z∈[-93,+50]mm · 10 of 100 slices shown]
[im 1/100]
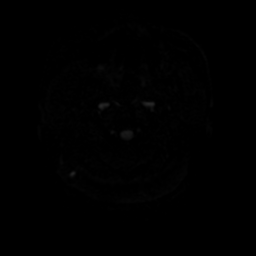
[im 12/100]
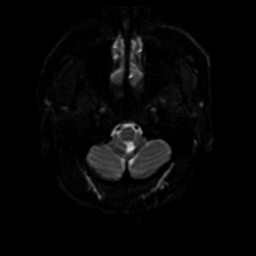
[im 23/100]
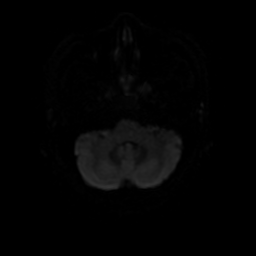
[im 34/100]
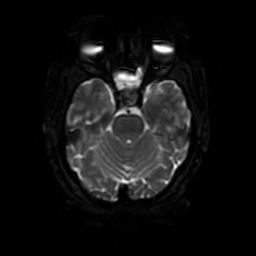
[im 45/100]
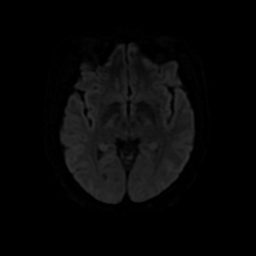
[im 56/100]
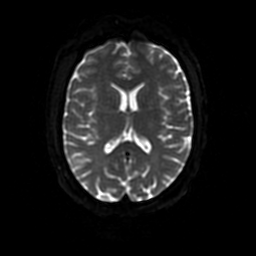
[im 67/100]
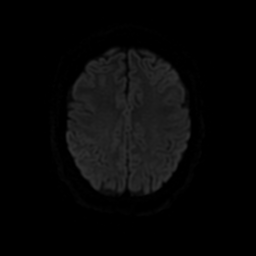
[im 78/100]
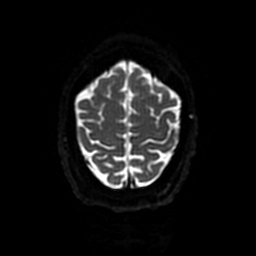
[im 89/100]
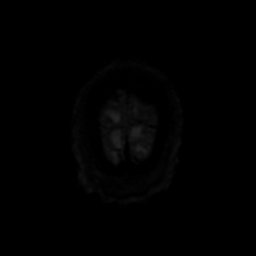
[im 100/100]
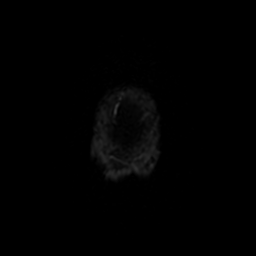

[Series 4: T2 · axial · 5.0mm · 0.43mm/px · z∈[-89,+52]mm · 2 of 25 slices shown]
[im 1/25]
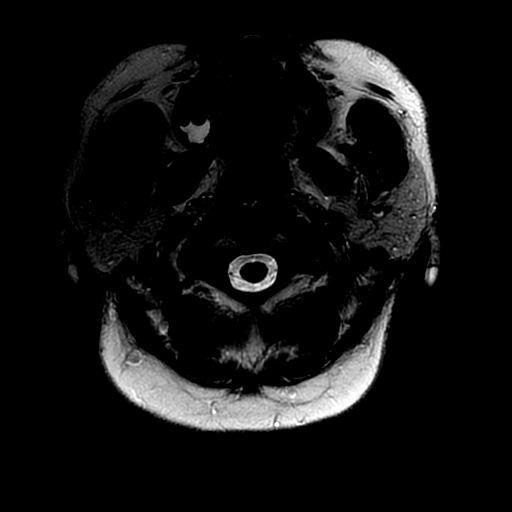
[im 25/25]
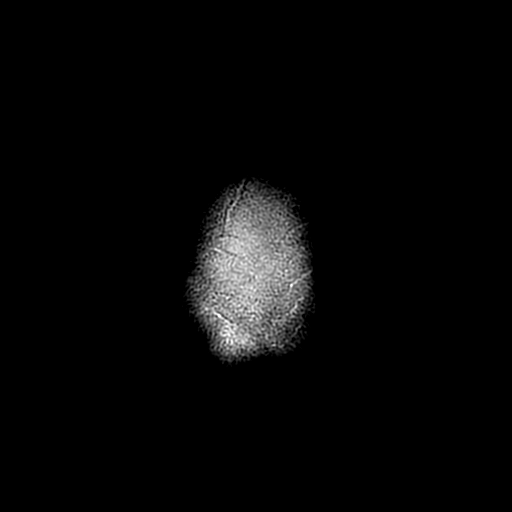

[Series 6: GRE · axial · 5.0mm · 0.86mm/px · z∈[-89,+52]mm · 2 of 25 slices shown]
[im 1/25]
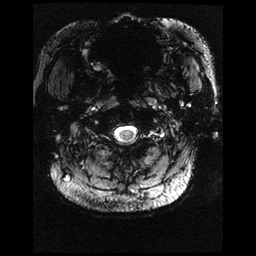
[im 25/25]
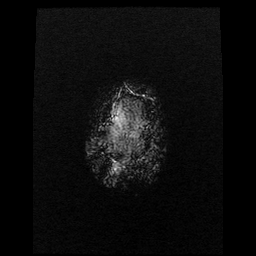

[Series 7: FLAIR · axial · 5.0mm · 0.43mm/px · z∈[-89,+52]mm · 2 of 25 slices shown]
[im 1/25]
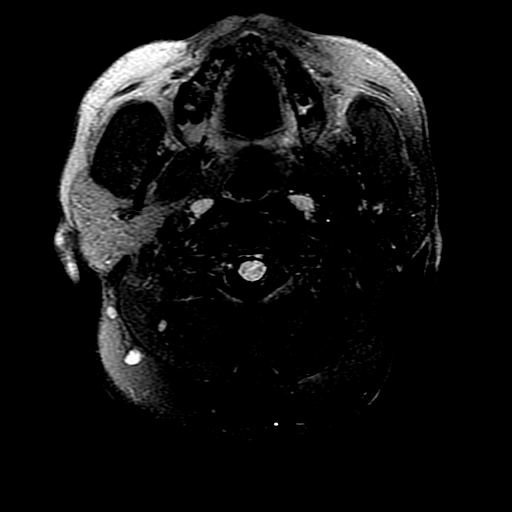
[im 25/25]
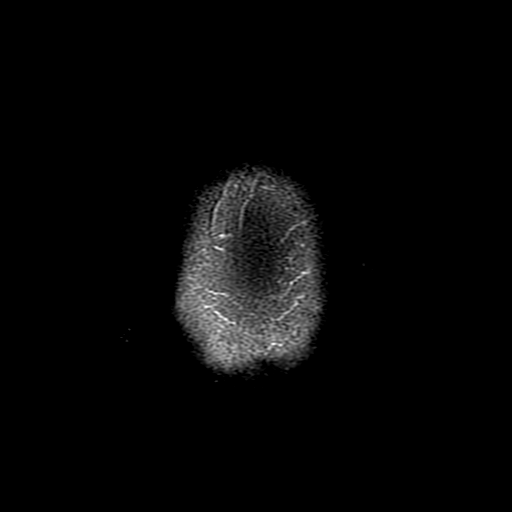

[Series 8: T1 · sagittal · 3.0mm · 0.31mm/px · 1 of 13 slices shown (1 of 2)]
[im 1/13]
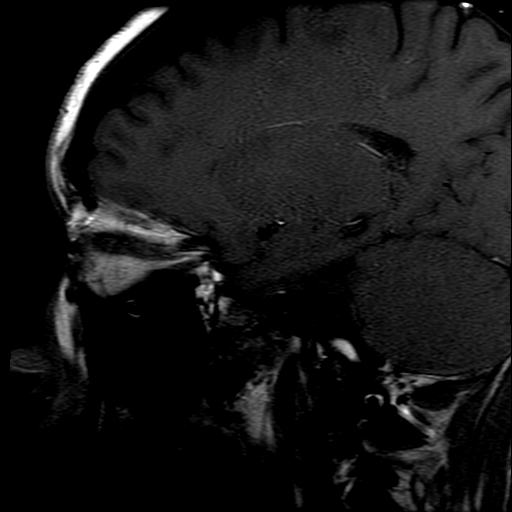

[Series 10: T1 · coronal · 3.0mm · 0.78mm/px · 11 of 120 slices shown (2 of 2)]
[im 1/120]
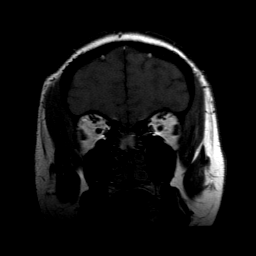
[im 12/120]
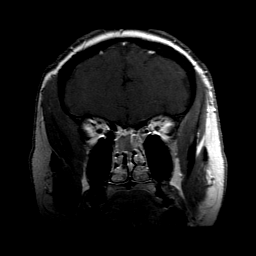
[im 24/120]
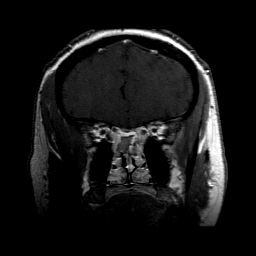
[im 36/120]
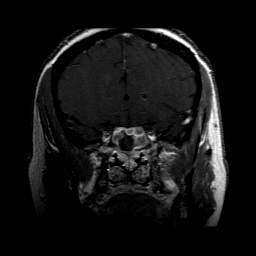
[im 48/120]
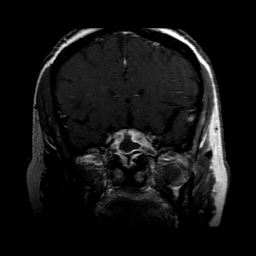
[im 60/120]
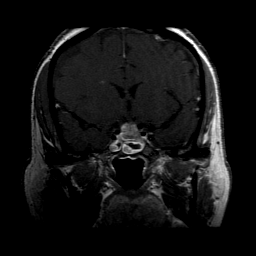
[im 72/120]
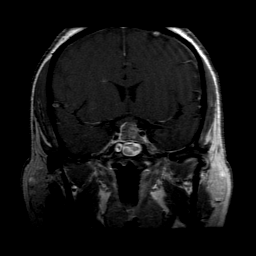
[im 84/120]
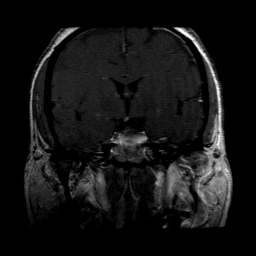
[im 96/120]
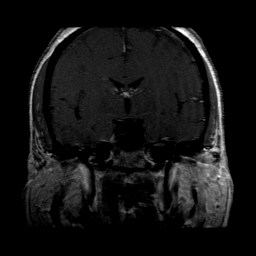
[im 108/120]
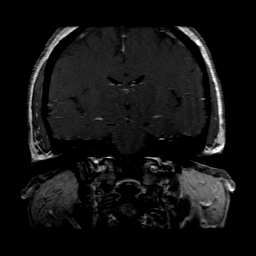
[im 120/120]
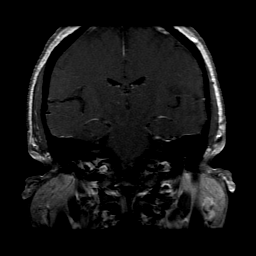

[Series 12: T1 post-contrast · sagittal · 3.0mm · 0.31mm/px · 1 of 13 slices shown (1 of 2)]
[im 1/13]
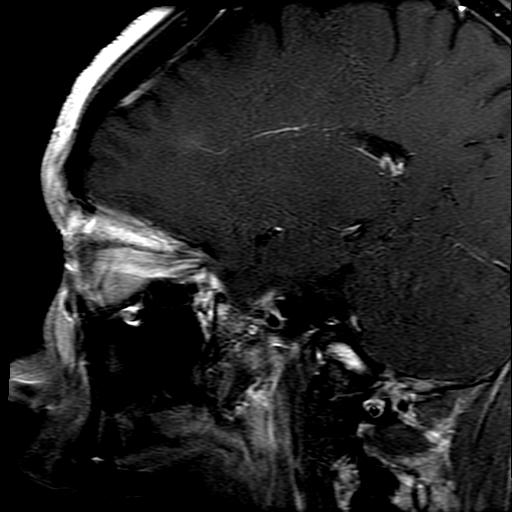

[Series 14: T1 post-contrast · coronal · 5.0mm · 0.39mm/px · 2 of 28 slices shown (2 of 2)]
[im 1/28]
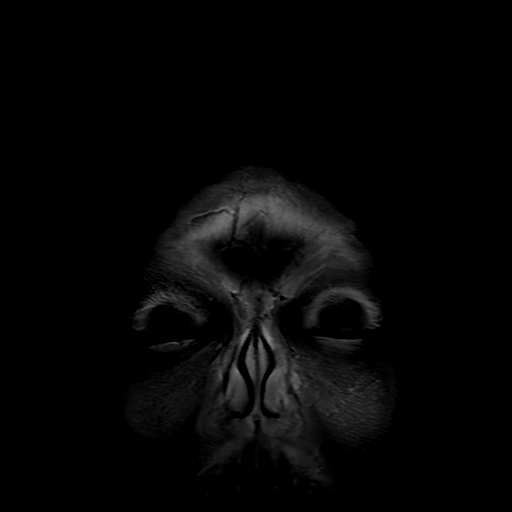
[im 28/28]
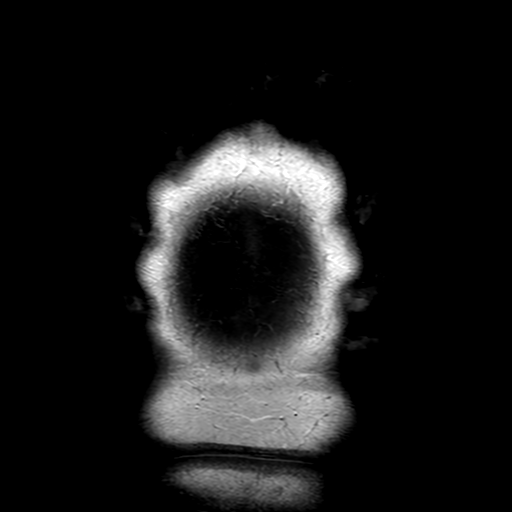

[Series 250: ADC · axial · 3.0mm · 0.94mm/px · z∈[-93,+50]mm · 4 of 50 slices shown]
[im 1/50]
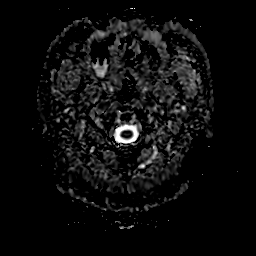
[im 17/50]
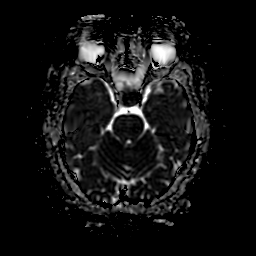
[im 33/50]
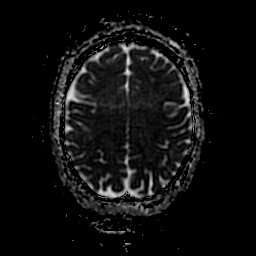
[im 50/50]
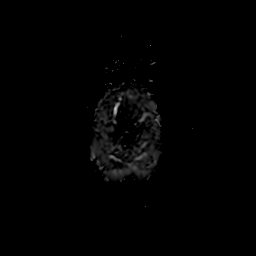

[35 of 48 positions shown; findings below may reference images not displayed]

FINDINGS: Brain:

Suprasellar brain is unremarkable. No significant white matter
disease is present. Ventricles are of normal size. No significant
extraaxial fluid collection is present.

The internal auditory canals are within normal limits. The brainstem
and cerebellum are within normal limits.

Patient is status post transsphenoidal resection of pituitary tumor.
Pituitary stalk extends to the right. There is significant debulking
of the tumor. Residual tumor mass measures 13 x 13 x 13 mm. The
optic chiasm is no longer displaced upwards. No invasion of the
cavernous sinus is present.

Vascular: Insert normal flow

Skull and upper cervical spine: The craniocervical junction is
normal. Upper cervical spine is within normal limits. Marrow signal
is unremarkable.

Sinuses/Orbits: Fat packing is present within the sphenoid sinus.
There is mucosal thickening and fluid dorsal to the fat packing.
Anterior sinuses and mastoid air cells are clear. Globes and orbits
are within normal limits.
IMPRESSION: 1. Residual enhancing tissue within the sella consistent with
adenoma.
2. No residual mass effect on the optic chiasm.
3. Sequelae of transsphenoidal resection.
4. Normal appearance of the suprasellar brain.

## 2019-11-23 MED ORDER — GADOBUTROL 1 MMOL/ML IV SOLN
9.6000 mL | Freq: Once | INTRAVENOUS | Status: AC | PRN
  Administered 2019-11-23: 9.6 mL via INTRAVENOUS

## 2019-12-30 ENCOUNTER — Other Ambulatory Visit: Payer: PRIVATE HEALTH INSURANCE

## 2020-08-02 ENCOUNTER — Other Ambulatory Visit: Payer: Self-pay | Admitting: Family Medicine

## 2020-08-02 DIAGNOSIS — N6310 Unspecified lump in the right breast, unspecified quadrant: Secondary | ICD-10-CM

## 2020-09-06 ENCOUNTER — Ambulatory Visit
Admission: RE | Admit: 2020-09-06 | Discharge: 2020-09-06 | Disposition: A | Discharge status: Home / Self Care | Payer: PRIVATE HEALTH INSURANCE | Source: Ambulatory Visit | Attending: Family Medicine | Admitting: Family Medicine

## 2020-09-06 ENCOUNTER — Ambulatory Visit
Admission: RE | Admit: 2020-09-06 | Discharge: 2020-09-06 | Disposition: A | Discharge status: Home / Self Care | Payer: BC Managed Care – PPO | Source: Ambulatory Visit | Attending: Family Medicine | Admitting: Family Medicine

## 2020-09-06 ENCOUNTER — Other Ambulatory Visit: Payer: Self-pay

## 2020-09-06 DIAGNOSIS — N6310 Unspecified lump in the right breast, unspecified quadrant: Secondary | ICD-10-CM

## 2020-09-06 IMAGING — MG DIGITAL DIAGNOSTIC BILAT W/ TOMO W/ CAD
8 series · 8 of 24 positions shown · non-contrast
Comparison: Previous exam(s).

CLINICAL DATA: Follow-up 2 right breast probably benign masses.

EXAM:
DIGITAL DIAGNOSTIC BILATERAL MAMMOGRAM WITH TOMOSYNTHESIS AND CAD;
ULTRASOUND RIGHT BREAST LIMITED
TECHNIQUE: Bilateral digital diagnostic mammography and breast tomosynthesis
was performed. The images were evaluated with computer-aided
detection.; Targeted ultrasound examination of the right breast was
performed

[L CC synth-2D]
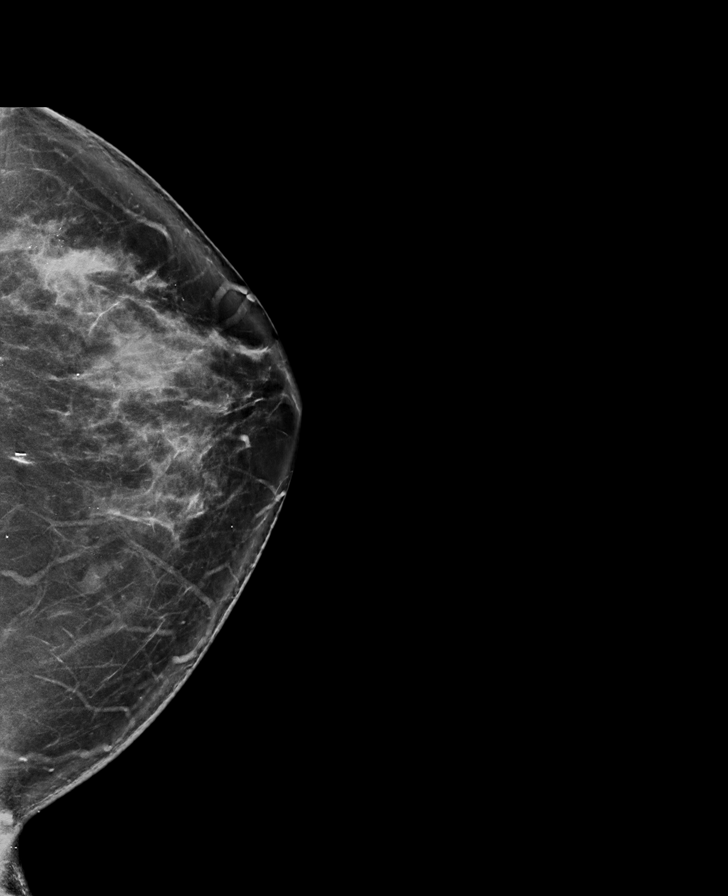

[R CC synth-2D]
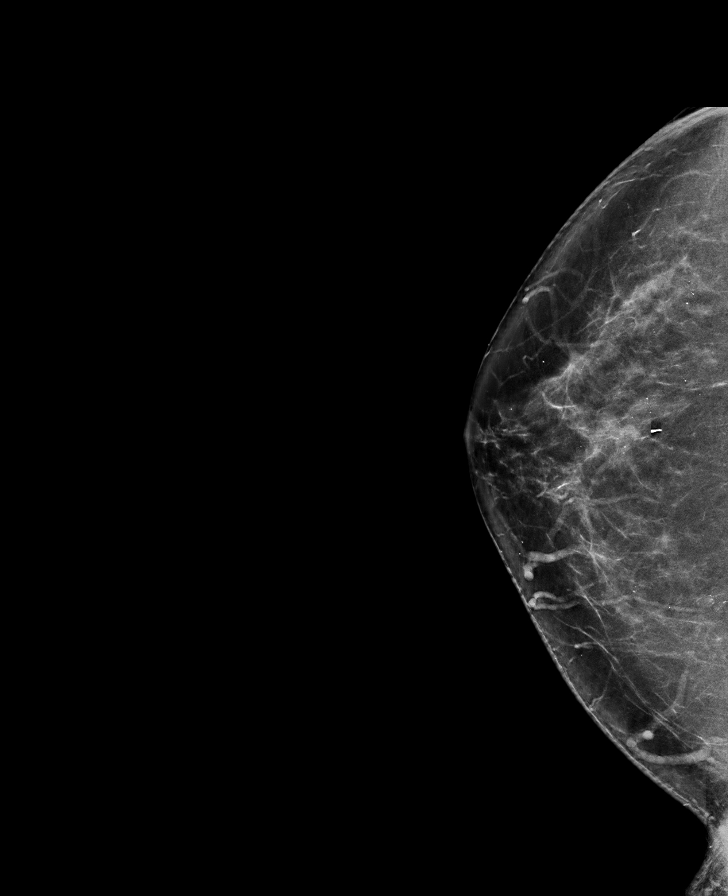

[R MLO synth-2D]
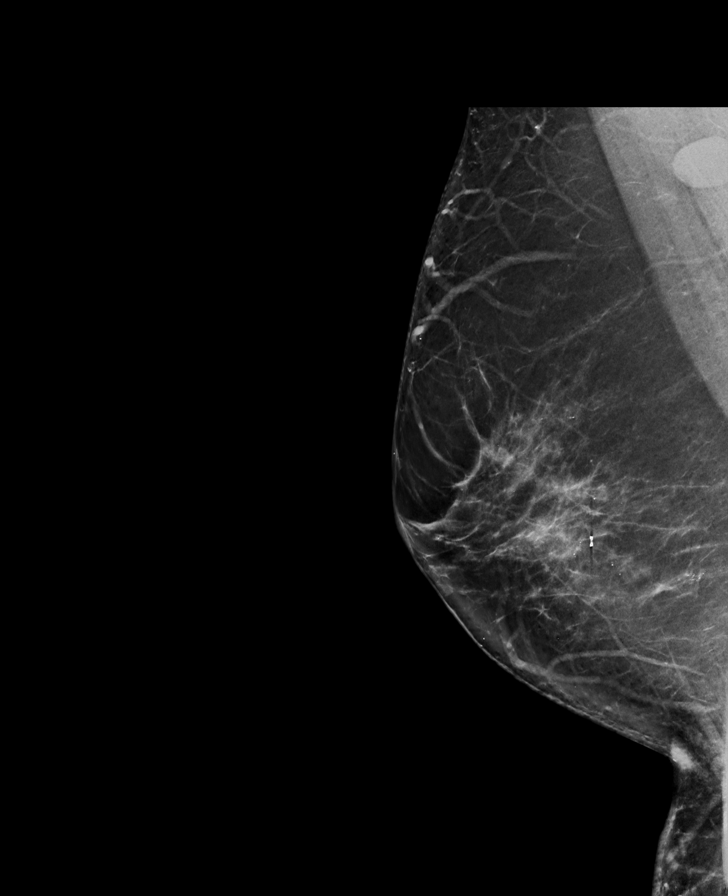

[L MLO synth-2D]
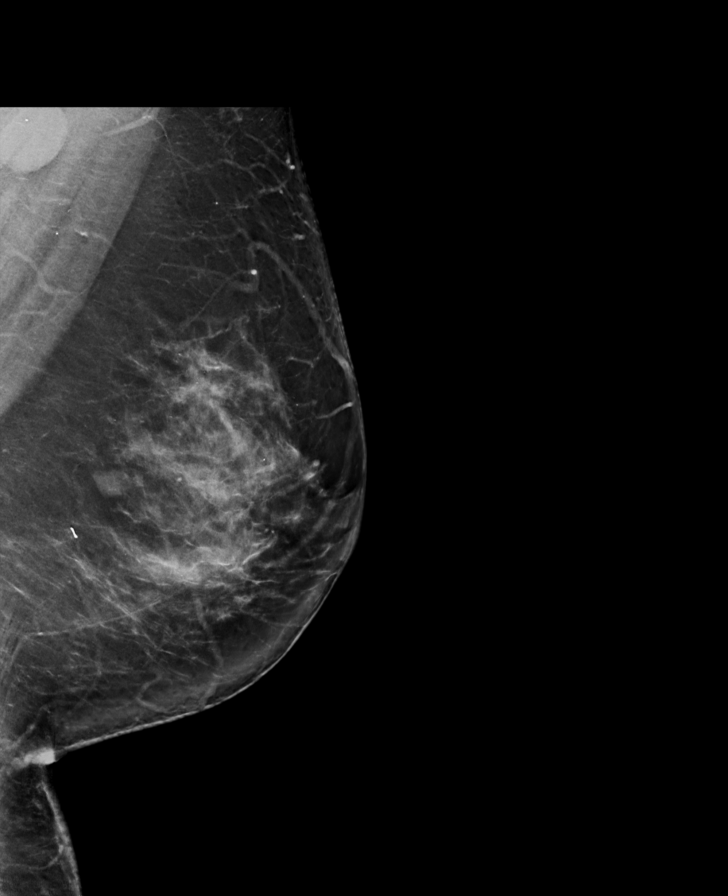

[L MLO tomo · tomo slice 49/98.0]
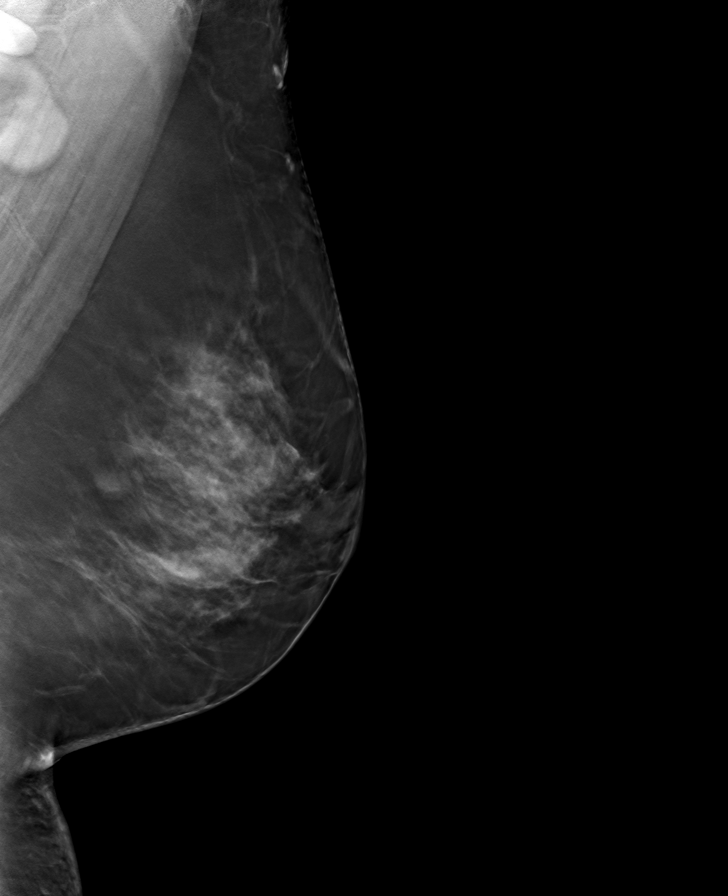

[L CC tomo · tomo slice 45/89.0]
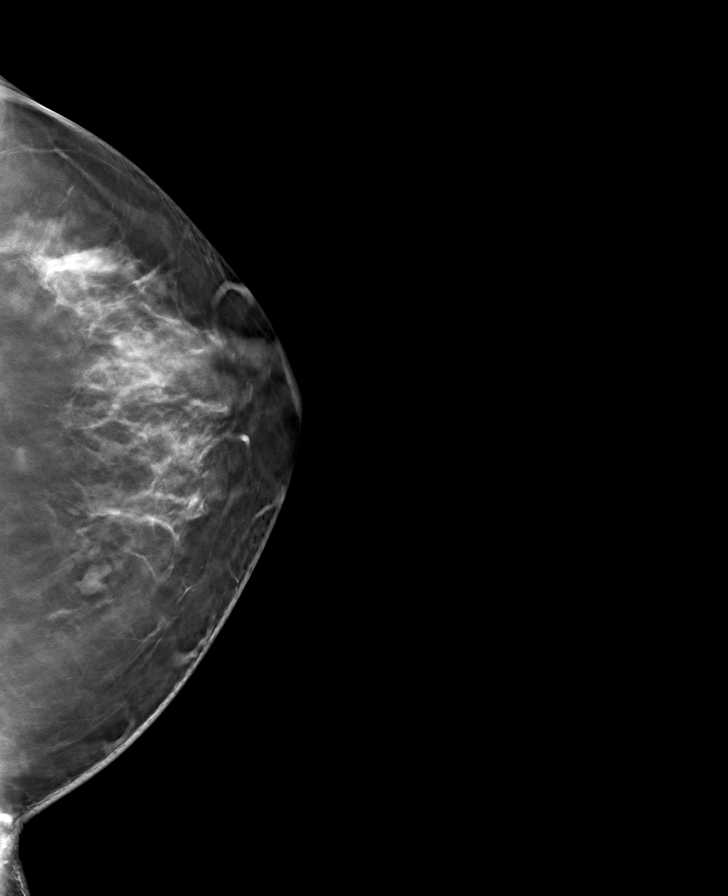

[R MLO tomo · tomo slice 47/92.0]
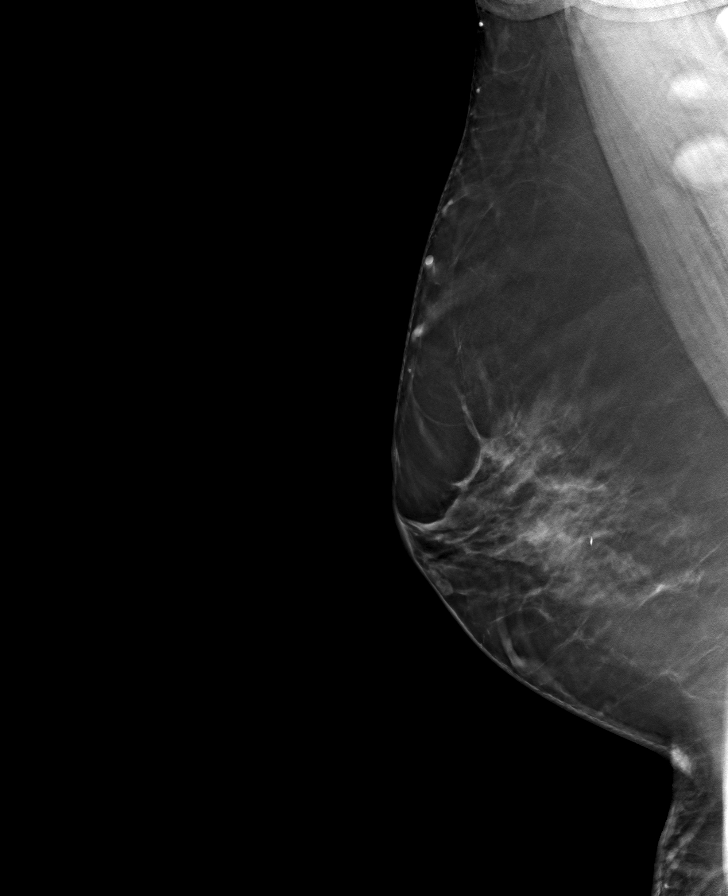

[R CC tomo · tomo slice 43/86.0]
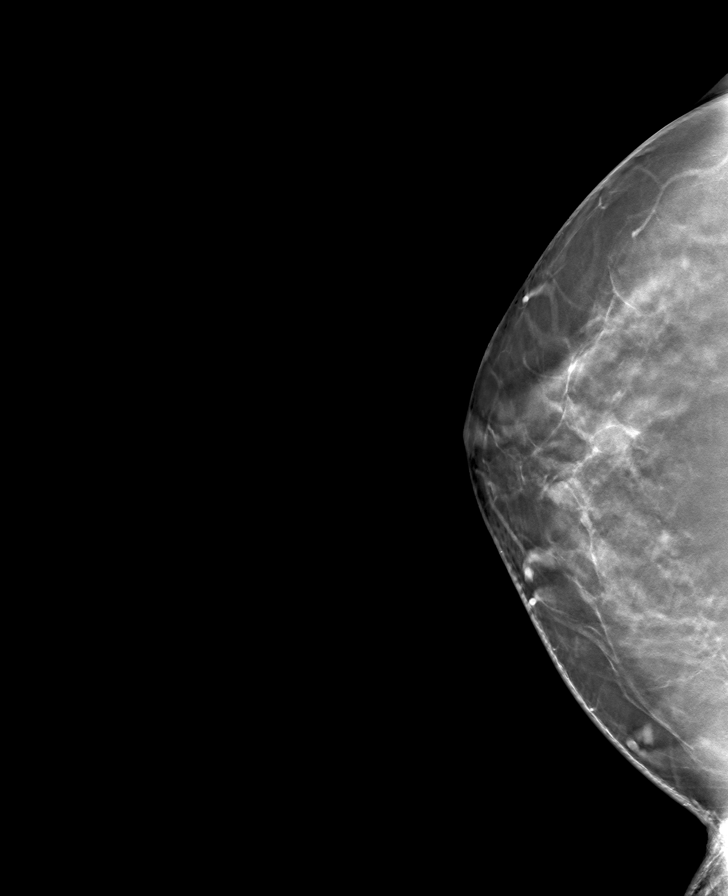

[8 of 24 positions shown; findings below may reference images not displayed]

ACR Breast Density Category c: The breast tissue is heterogeneously
dense, which may obscure small masses.
FINDINGS: The mass seen in the posterior upper outer right breast on
[DATE] is no longer demonstrated. No interval findings
suspicious for malignancy in either breast.

Targeted ultrasound is performed, showing a 6 x 4 x 3 mm oval,
horizontally oriented, mildly hypoechoic mass in the 12 o'clock
position of the right breast, 2 cm from the nipple. This measured 6
x 5 x 2 mm on the initial ultrasound dated [DATE].

In the 2 o'clock position of the right breast, 2 cm from the nipple,
a 9 x 4 x 4 mm oval, horizontally oriented, circumscribed,
hypoechoic mass is demonstrated. This measured 9 x 6 x 4 mm on the
initial ultrasound dated [DATE].
IMPRESSION: 1. Stable previously demonstrated benign appearing masses in the 12
o'clock and 2 o'clock positions of the right breast with no growth
in 2 years and 8 months. The lack of growth during that period of
time is compatible with a benign process. These do not need further
follow-up.
2. No evidence of malignancy in either breast.

RECOMMENDATION:
Bilateral screening mammogram in 1 year.

I have discussed the findings and recommendations with the patient.
If applicable, a reminder letter will be sent to the patient
regarding the next appointment.

BI-RADS CATEGORY  2: Benign.

## 2020-09-06 IMAGING — US US BREAST*R* LIMITED INC AXILLA
1 series · 11 of 11 positions shown · non-contrast
Comparison: Previous exam(s).

CLINICAL DATA: Follow-up 2 right breast probably benign masses.

EXAM:
DIGITAL DIAGNOSTIC BILATERAL MAMMOGRAM WITH TOMOSYNTHESIS AND CAD;
ULTRASOUND RIGHT BREAST LIMITED
TECHNIQUE: Bilateral digital diagnostic mammography and breast tomosynthesis
was performed. The images were evaluated with computer-aided
detection.; Targeted ultrasound examination of the right breast was
performed

[Series 1: us breast*right* limited inc axilla · 0.06mm/px · 11 of 11 slices shown]
[im 1/11]
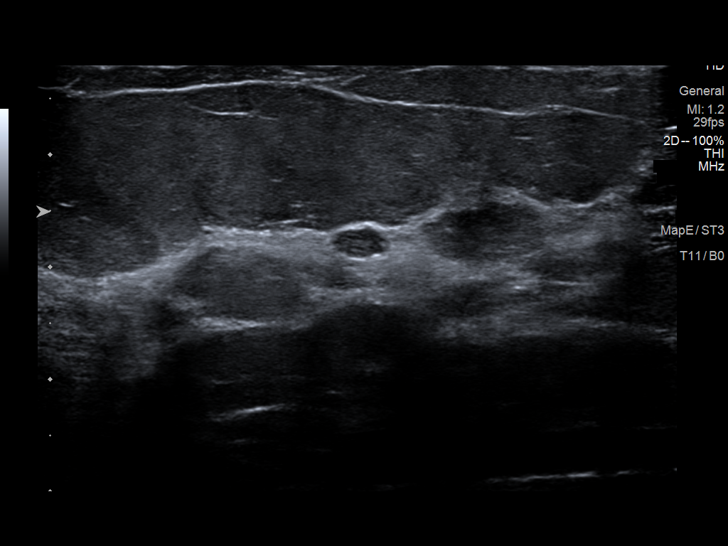
[im 2/11]
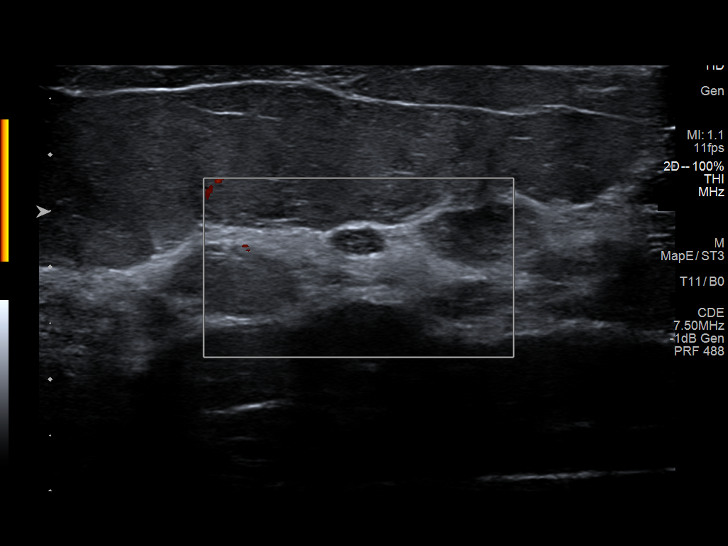
[im 3/11]
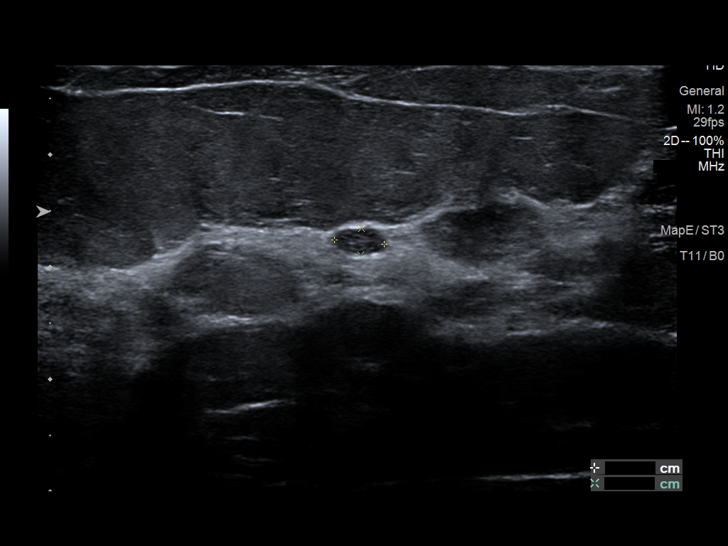
[im 4/11]
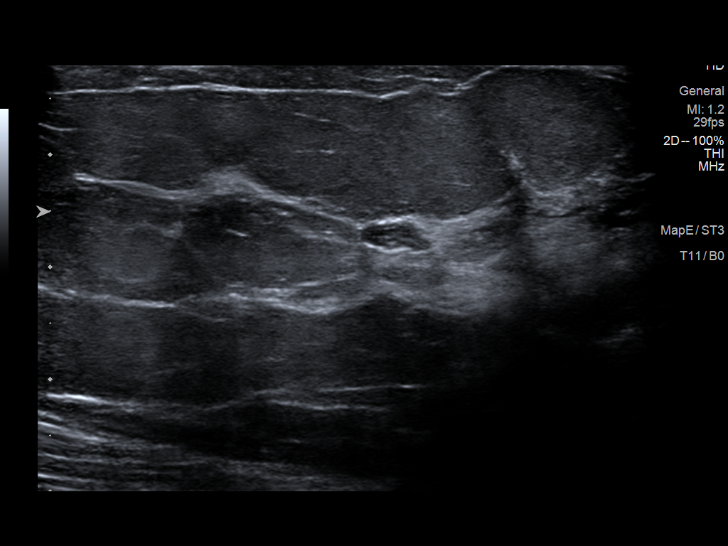
[im 5/11]
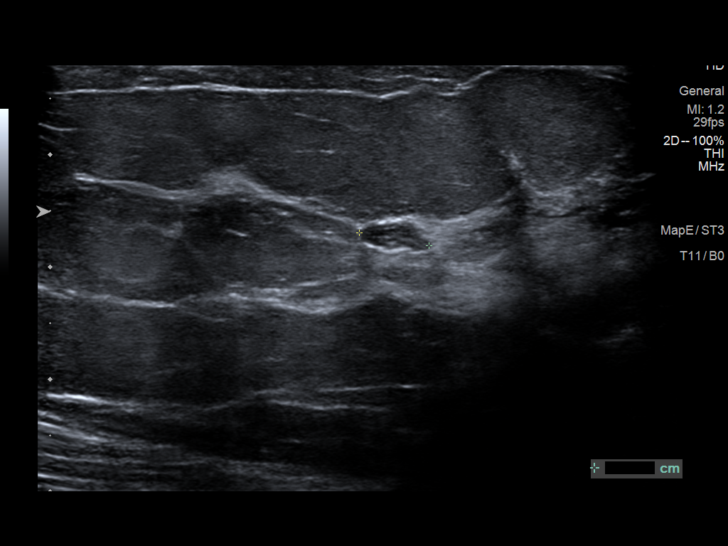
[im 6/11]
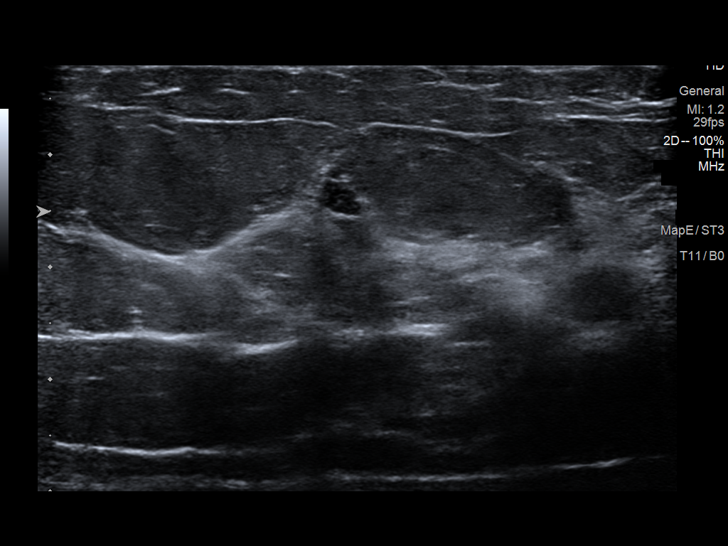
[im 7/11]
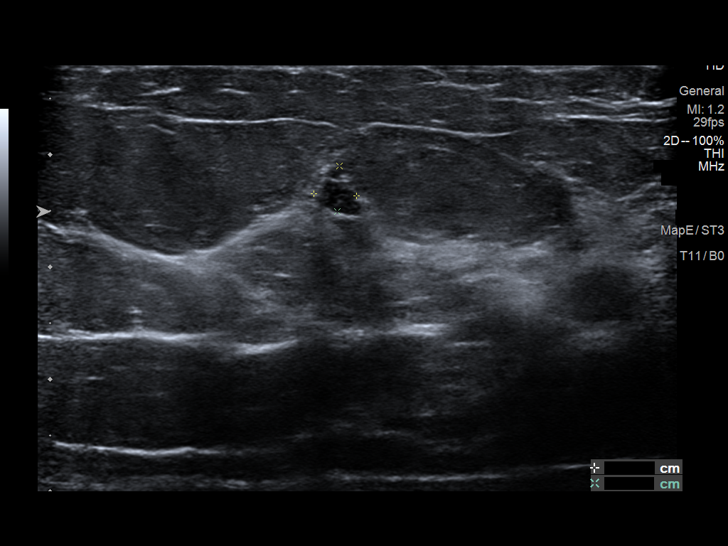
[im 8/11]
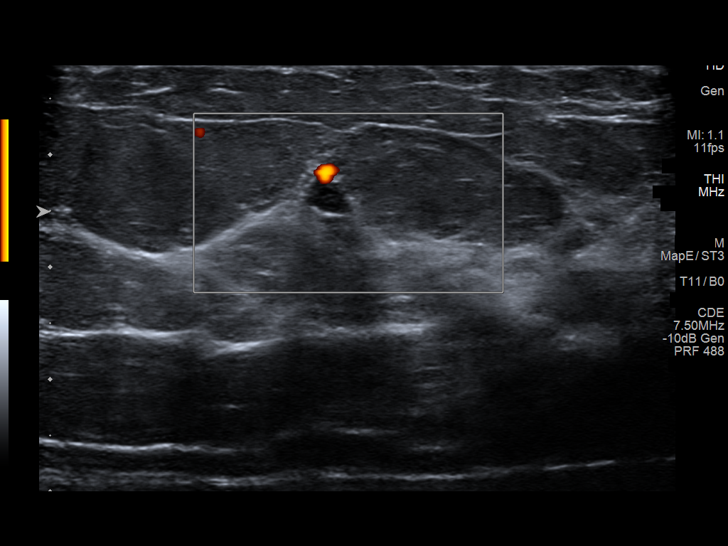
[im 9/11]
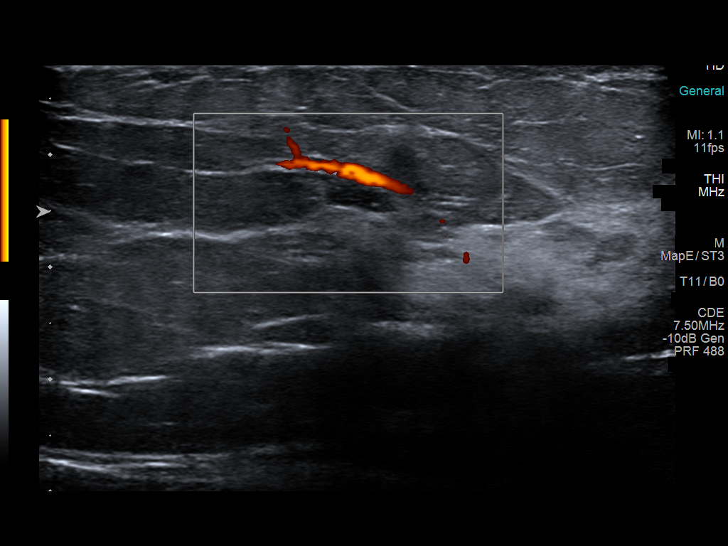
[im 10/11]
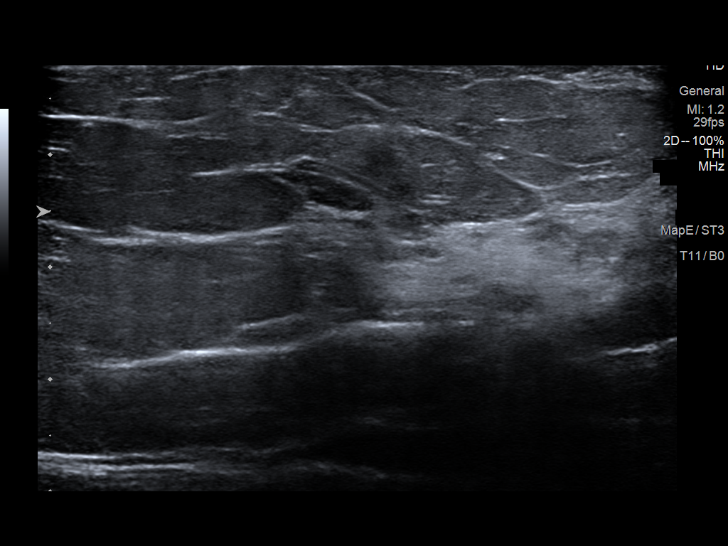
[im 11/11]
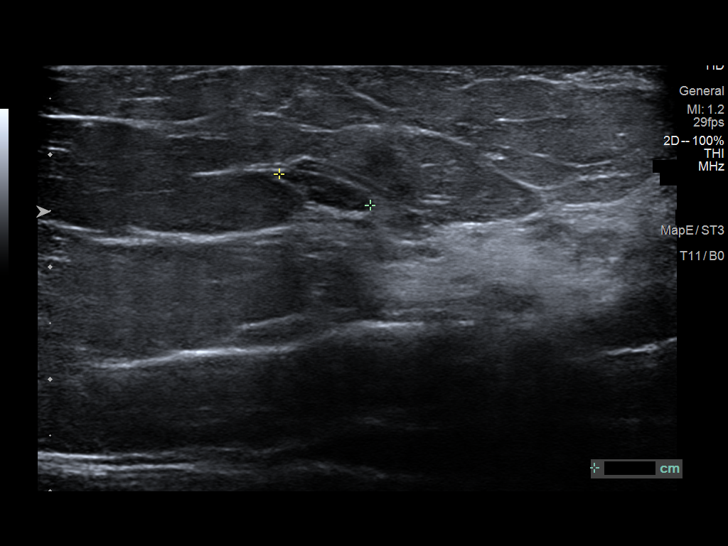

[11 of 11 positions shown; findings below may reference images not displayed]

ACR Breast Density Category c: The breast tissue is heterogeneously
dense, which may obscure small masses.
FINDINGS: The mass seen in the posterior upper outer right breast on
[DATE] is no longer demonstrated. No interval findings
suspicious for malignancy in either breast.

Targeted ultrasound is performed, showing a 6 x 4 x 3 mm oval,
horizontally oriented, mildly hypoechoic mass in the 12 o'clock
position of the right breast, 2 cm from the nipple. This measured 6
x 5 x 2 mm on the initial ultrasound dated [DATE].

In the 2 o'clock position of the right breast, 2 cm from the nipple,
a 9 x 4 x 4 mm oval, horizontally oriented, circumscribed,
hypoechoic mass is demonstrated. This measured 9 x 6 x 4 mm on the
initial ultrasound dated [DATE].
IMPRESSION: 1. Stable previously demonstrated benign appearing masses in the 12
o'clock and 2 o'clock positions of the right breast with no growth
in 2 years and 8 months. The lack of growth during that period of
time is compatible with a benign process. These do not need further
follow-up.
2. No evidence of malignancy in either breast.

RECOMMENDATION:
Bilateral screening mammogram in 1 year.

I have discussed the findings and recommendations with the patient.
If applicable, a reminder letter will be sent to the patient
regarding the next appointment.

BI-RADS CATEGORY  2: Benign.

## 2021-08-07 ENCOUNTER — Other Ambulatory Visit: Payer: Self-pay | Admitting: Family Medicine

## 2021-08-07 DIAGNOSIS — Z1231 Encounter for screening mammogram for malignant neoplasm of breast: Secondary | ICD-10-CM

## 2021-09-08 ENCOUNTER — Ambulatory Visit
Admission: RE | Admit: 2021-09-08 | Discharge: 2021-09-08 | Disposition: A | Discharge status: Home / Self Care | Payer: BC Managed Care – PPO | Source: Ambulatory Visit | Attending: Family Medicine | Admitting: Family Medicine

## 2021-09-08 DIAGNOSIS — Z1231 Encounter for screening mammogram for malignant neoplasm of breast: Secondary | ICD-10-CM

## 2021-09-08 IMAGING — MG MM DIGITAL SCREENING BILAT W/ TOMO AND CAD
8 series · 8 of 24 positions shown · non-contrast
Comparison: Previous exam(s).

CLINICAL DATA: Screening.

EXAM:
DIGITAL SCREENING BILATERAL MAMMOGRAM WITH TOMOSYNTHESIS AND CAD
TECHNIQUE: Bilateral screening digital craniocaudal and mediolateral oblique
mammograms were obtained. Bilateral screening digital breast
tomosynthesis was performed. The images were evaluated with
computer-aided detection.

[L CC synth-2D]
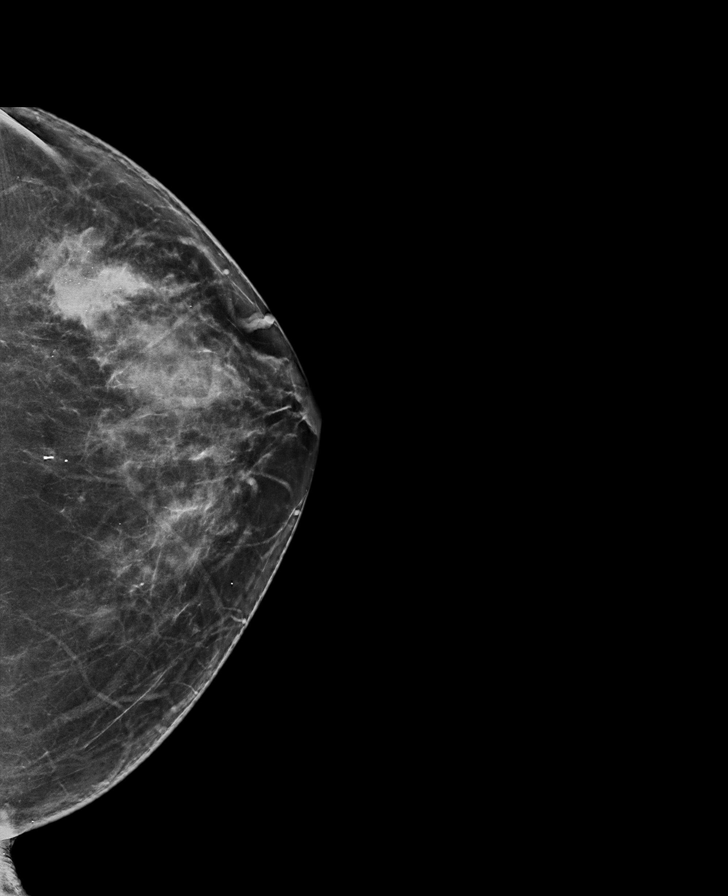

[R CC synth-2D]
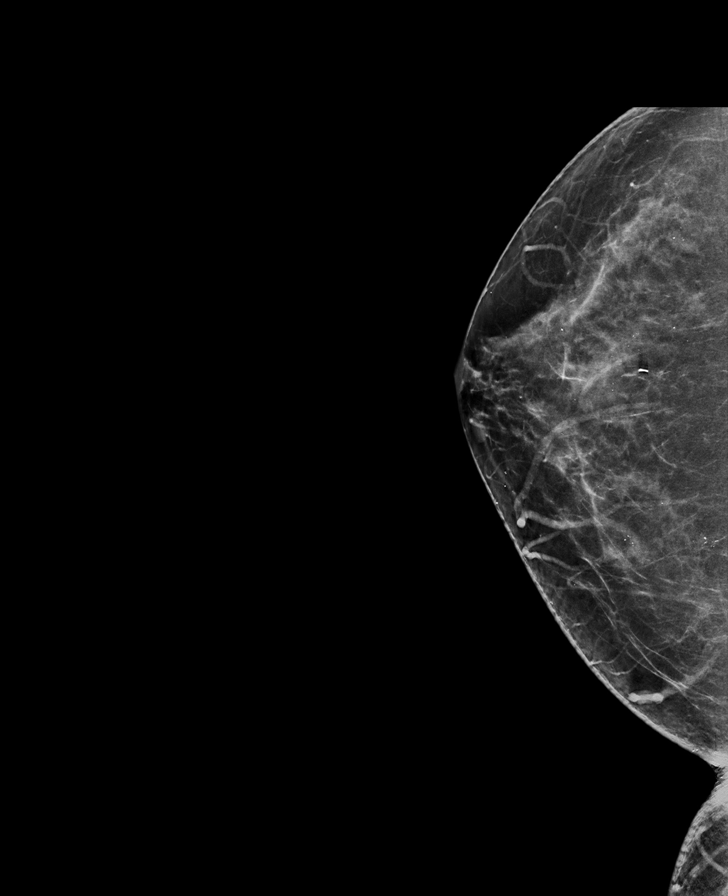

[L MLO synth-2D]
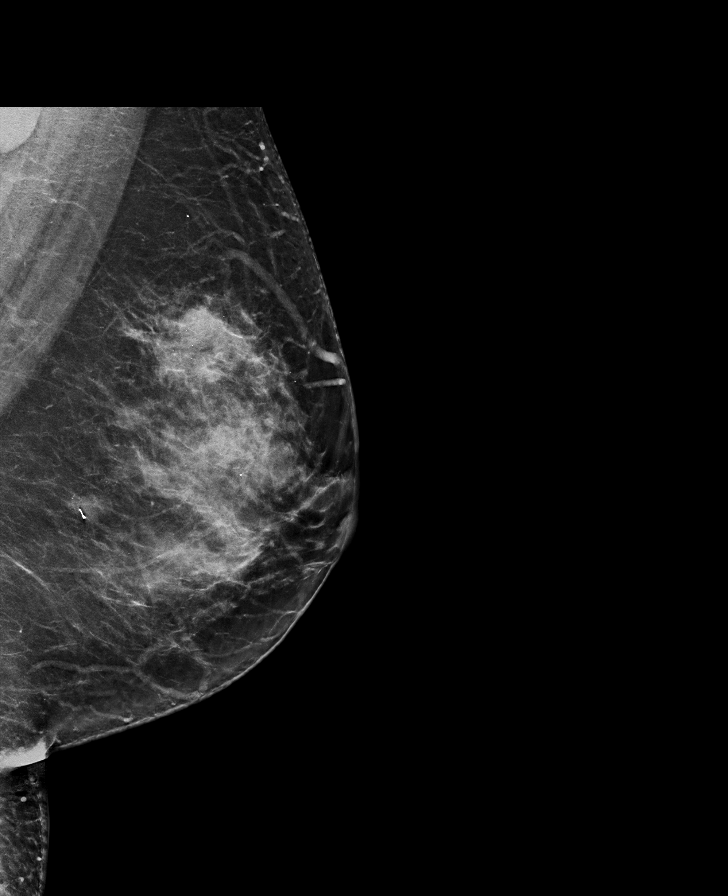

[R MLO synth-2D]
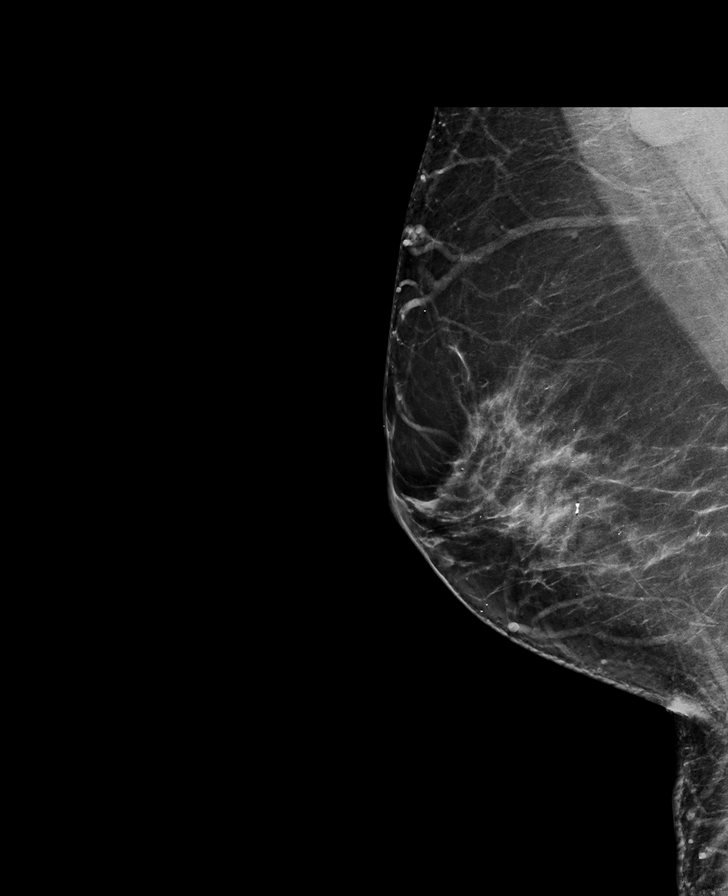

[L CC tomo · tomo slice 39/76.0]
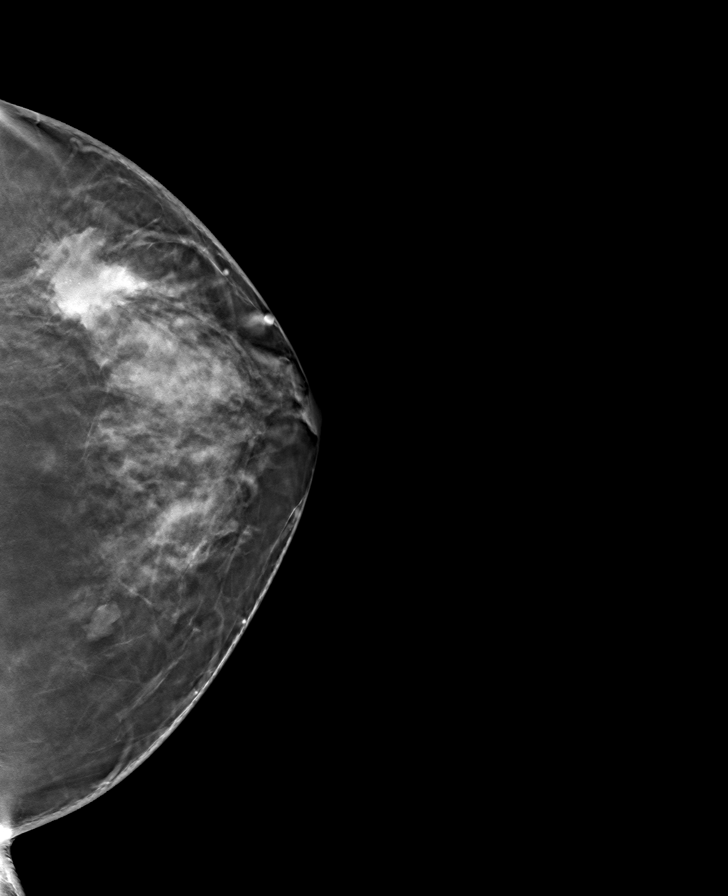

[R CC tomo · tomo slice 35/69.0]
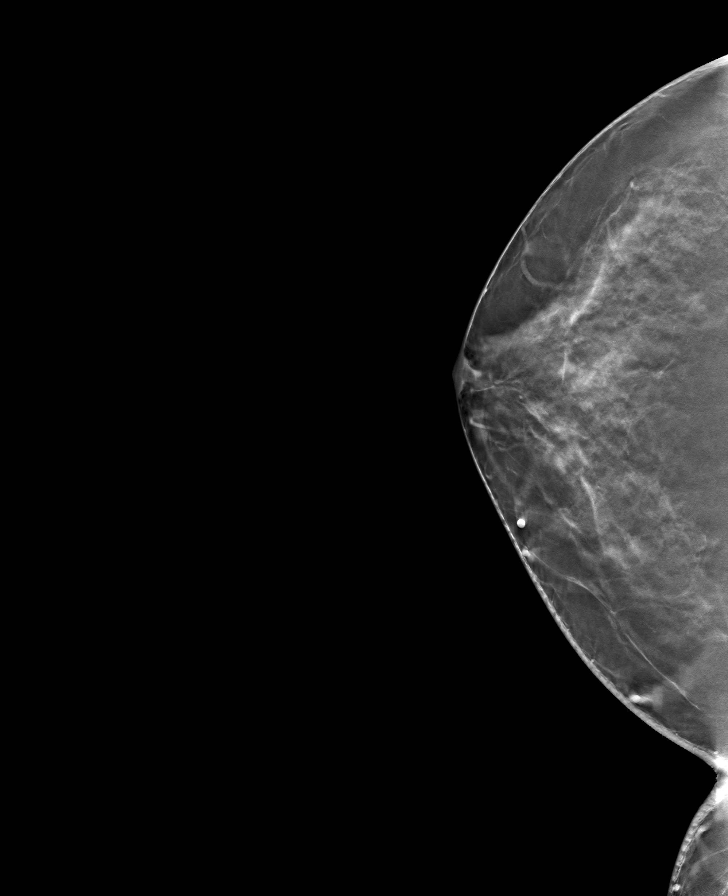

[R MLO tomo · tomo slice 41/82.0]
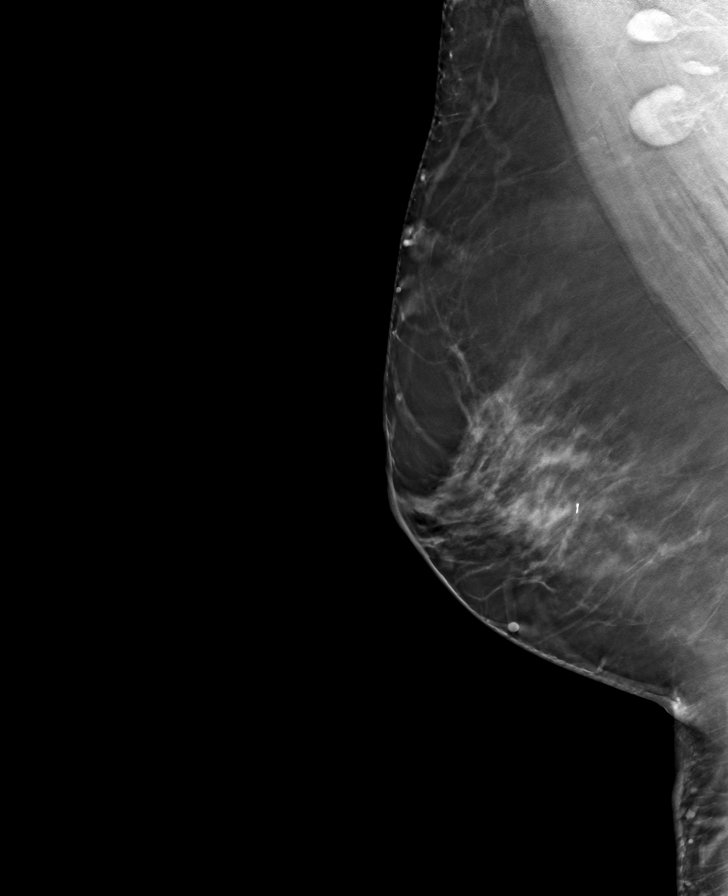

[L MLO tomo · tomo slice 41/80.0]
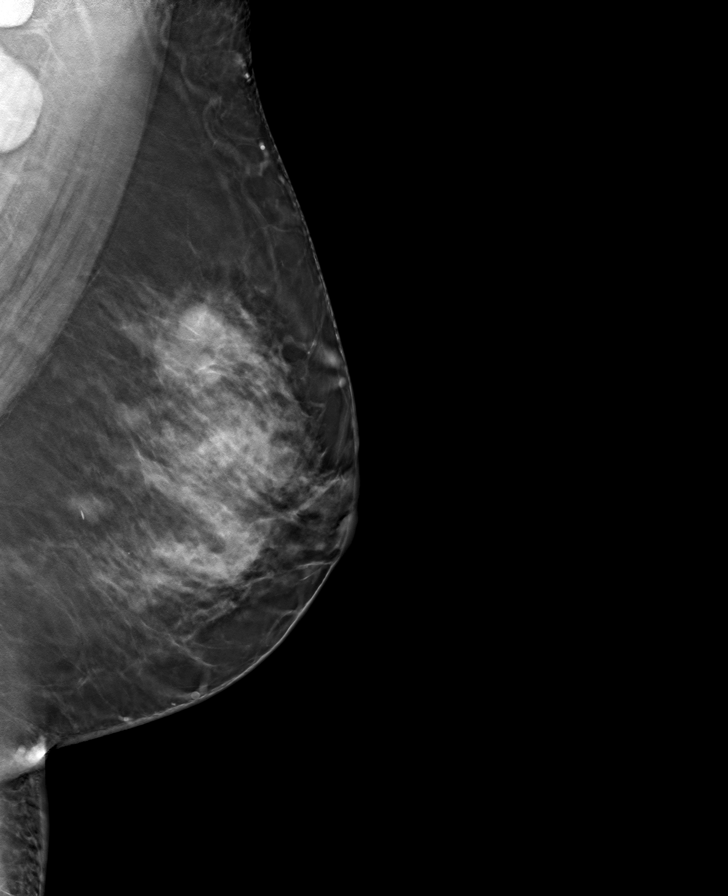

[8 of 24 positions shown; findings below may reference images not displayed]

ACR Breast Density Category b: There are scattered areas of
fibroglandular density.
FINDINGS: In the left breast, a possible mass and calcifications warrants
further evaluation. In the right breast, no findings suspicious for
malignancy.
IMPRESSION: Further evaluation is suggested for a possible mass and
calcifications in the left breast.

RECOMMENDATION:
Diagnostic mammogram and possibly ultrasound of the left breast.
(Code:[FZ])

The patient will be contacted regarding the findings, and additional
imaging will be scheduled.

BI-RADS CATEGORY  0: Incomplete. Need additional imaging evaluation
and/or prior mammograms for comparison.

## 2021-09-12 ENCOUNTER — Other Ambulatory Visit: Payer: Self-pay | Admitting: Family Medicine

## 2021-09-12 DIAGNOSIS — R928 Other abnormal and inconclusive findings on diagnostic imaging of breast: Secondary | ICD-10-CM

## 2021-09-26 ENCOUNTER — Ambulatory Visit
Admission: RE | Admit: 2021-09-26 | Discharge: 2021-09-26 | Disposition: A | Discharge status: Home / Self Care | Payer: BC Managed Care – PPO | Source: Ambulatory Visit | Attending: Family Medicine | Admitting: Family Medicine

## 2021-09-26 ENCOUNTER — Other Ambulatory Visit: Payer: Self-pay | Admitting: Family Medicine

## 2021-09-26 DIAGNOSIS — R599 Enlarged lymph nodes, unspecified: Secondary | ICD-10-CM

## 2021-09-26 DIAGNOSIS — R928 Other abnormal and inconclusive findings on diagnostic imaging of breast: Secondary | ICD-10-CM

## 2021-09-26 DIAGNOSIS — N6320 Unspecified lump in the left breast, unspecified quadrant: Secondary | ICD-10-CM

## 2021-09-26 IMAGING — US US BREAST*L* LIMITED INC AXILLA
1 series · 9 of 9 positions shown · non-contrast
Comparison: Previous exams including recent screening mammogram
dated [DATE].

CLINICAL DATA: Patient returns today to evaluate a LEFT breast mass
with associated calcifications identified on recent screening
mammogram.

EXAM:
DIGITAL DIAGNOSTIC UNILATERAL LEFT MAMMOGRAM WITH TOMOSYNTHESIS AND
CAD; ULTRASOUND LEFT BREAST LIMITED
TECHNIQUE: Left digital diagnostic mammography and breast tomosynthesis was
performed. The images were evaluated with computer-aided detection.;
Targeted ultrasound examination of the left breast was performed.

[Series 1: us breast*left* limited inc axilla · 0.07mm/px · 9 of 9 slices shown]
[im 1/9]
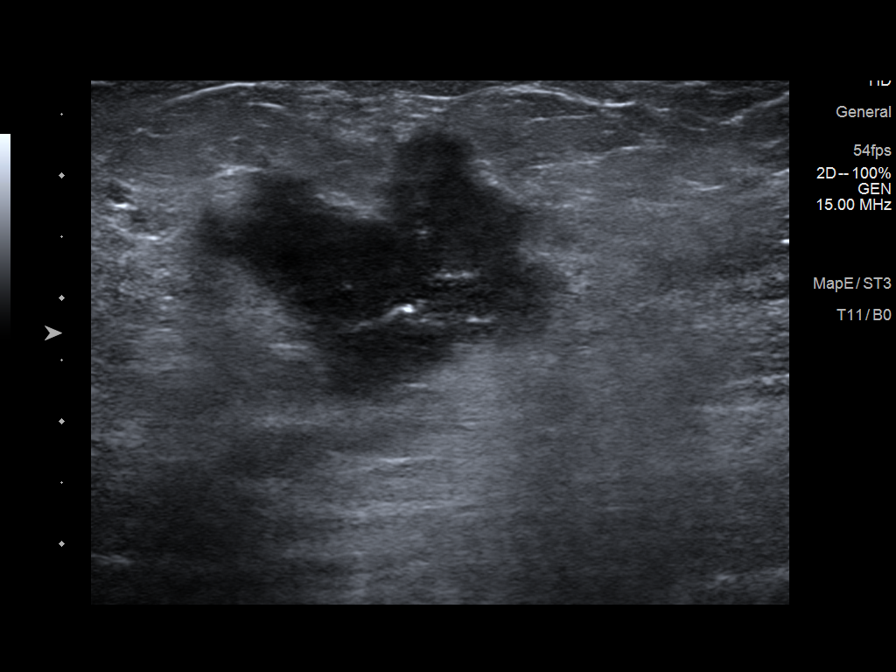
[im 2/9]
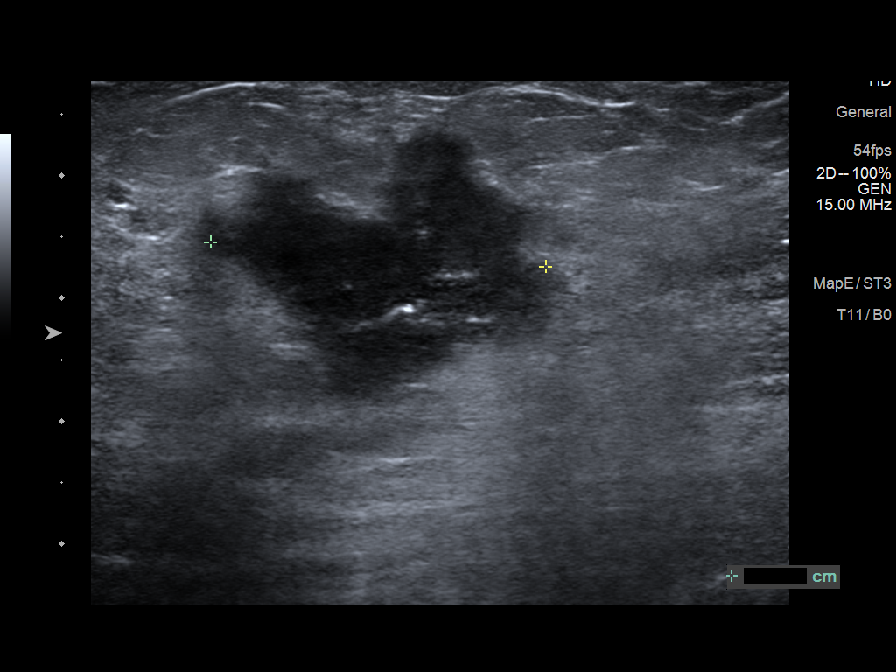
[im 3/9]
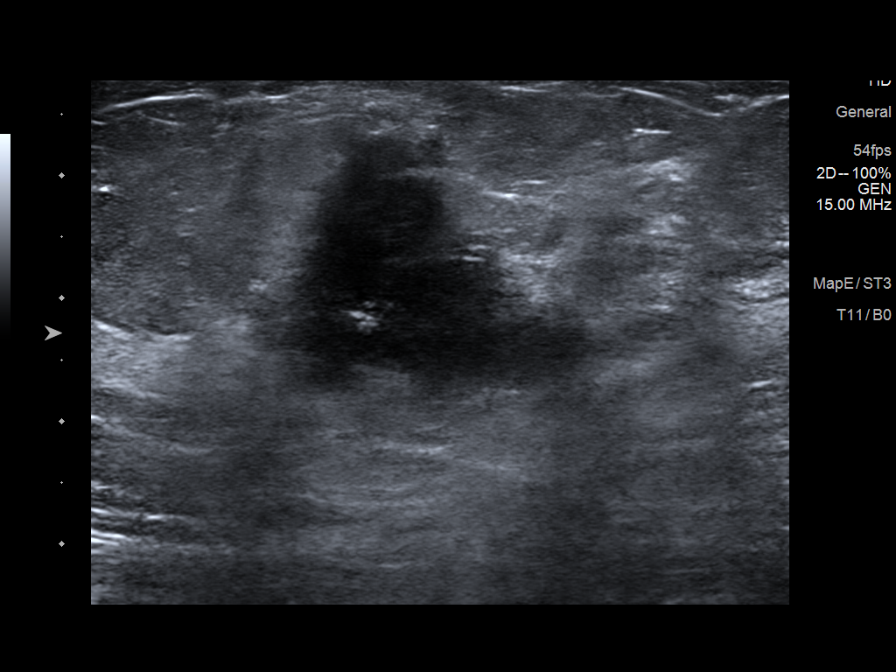
[im 4/9]
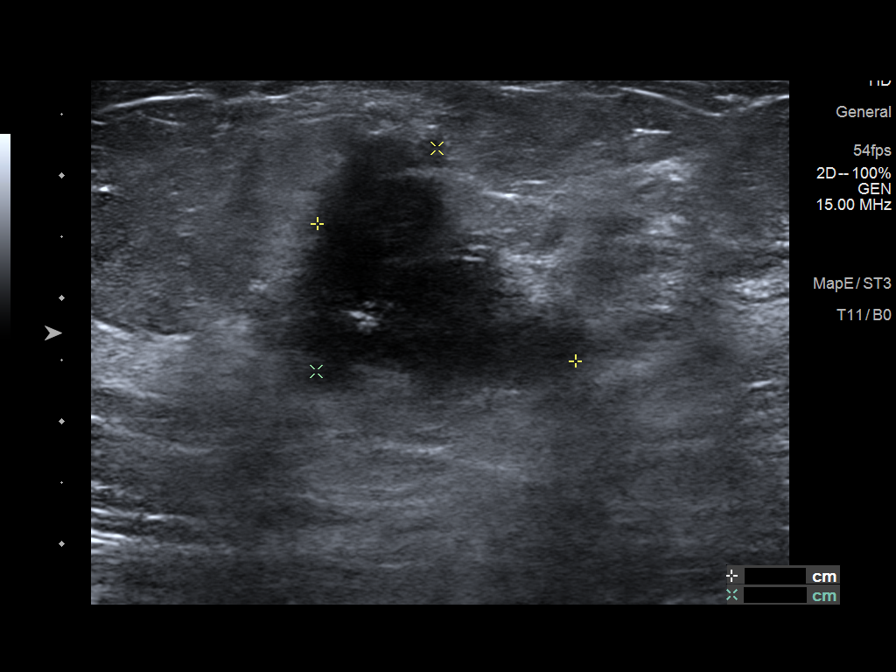
[im 5/9]
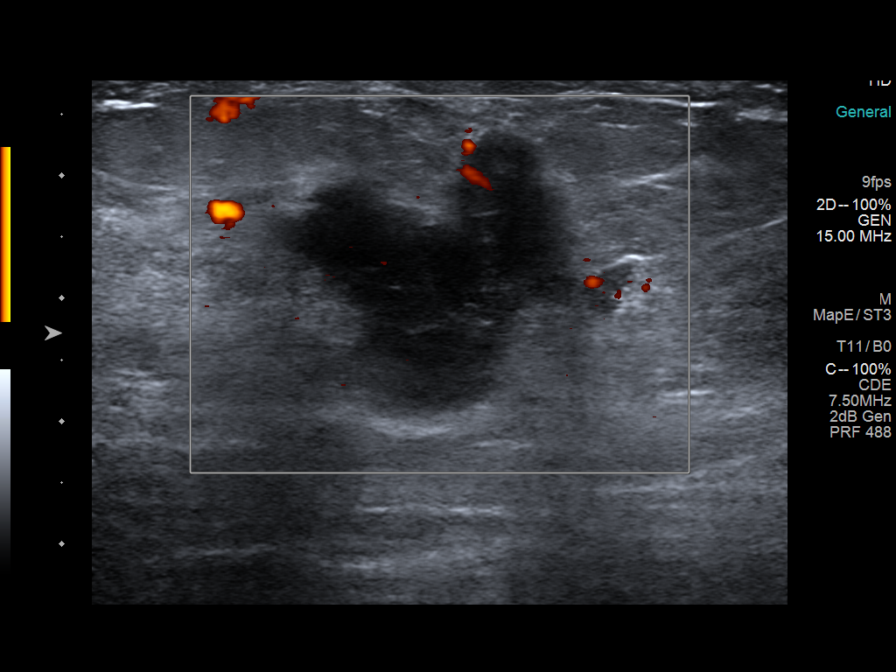
[im 6/9]
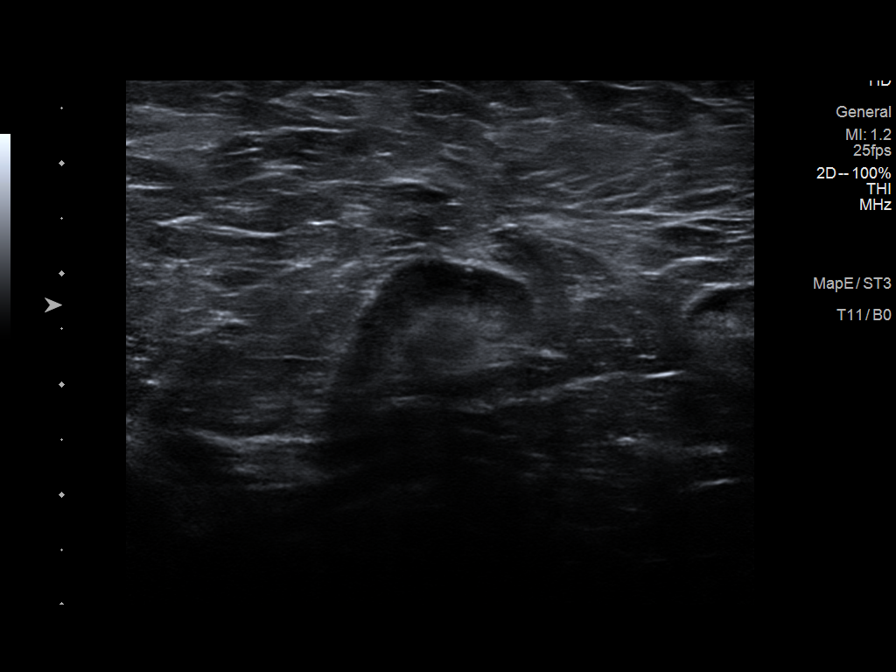
[im 7/9]
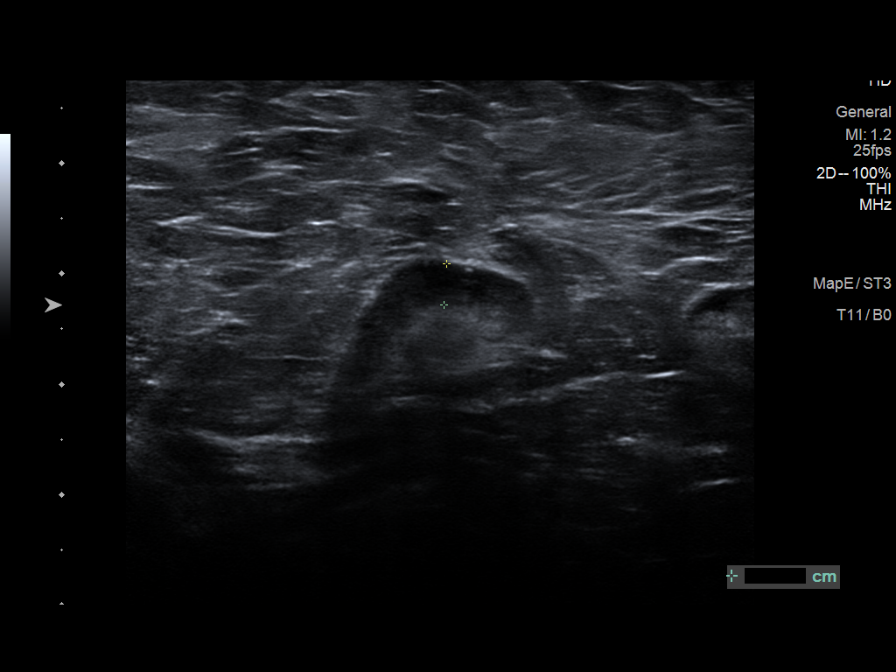
[im 8/9]
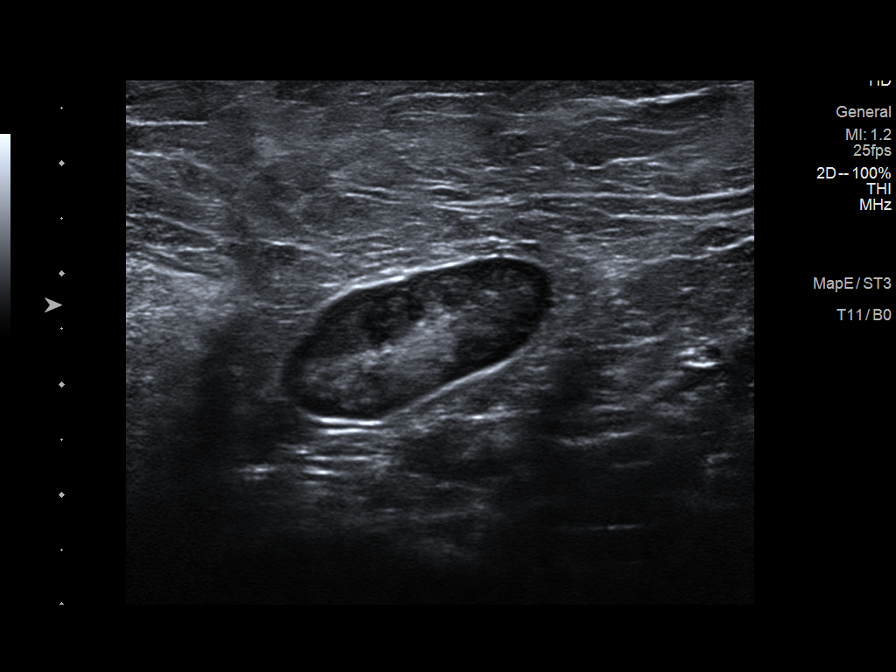
[im 9/9]
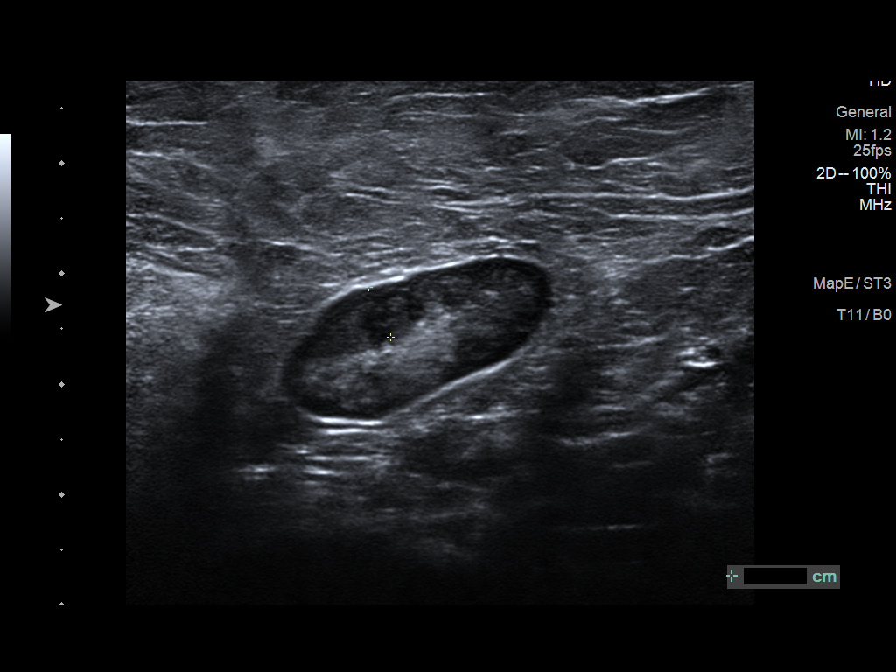

[9 of 9 positions shown; findings below may reference images not displayed]

ACR Breast Density Category b: There are scattered areas of
fibroglandular density.
FINDINGS: On today's additional diagnostic views, there is an irregular mass
confirmed within the upper-outer quadrant of the LEFT breast, with
associated microcalcifications, measuring approximately 2.6 cm
greatest dimension.

Targeted ultrasound is performed, showing an irregular hypoechoic
mass in the LEFT breast at the 3 o'clock axis, 6 cm from the nipple,
measuring 2.7 x 2.1 x 2.4 cm, corresponding to the mammographic
finding.

There are 2 enlarged lymph nodes identified within the LEFT axilla,
with most prominent lymph node demonstrating a cortex thickness of 5
mm.
IMPRESSION: 1. Highly suspicious mass in the LEFT breast at the 3 o'clock axis,
6 cm from the nipple, measuring 2.7 cm. Ultrasound-guided biopsy is
recommended.
2. Two enlarged lymph nodes within the LEFT axilla, most prominent
lymph node demonstrating a cortex thickness of 5 mm. This is
suspicious for metastatic lymphadenopathy. Ultrasound-guided biopsy
is recommended for 1 of these lymph nodes.

RECOMMENDATION:
1. Ultrasound-guided biopsy of the highly suspicious mass in the
LEFT breast at the 3 o'clock axis.
2. Ultrasound-guided biopsy of 1 of the enlarged lymph nodes within
the LEFT axilla.

Ultrasound-guided biopsies are scheduled on [REDACTED].

I have discussed the findings and recommendations with the patient.
If applicable, a reminder letter will be sent to the patient
regarding the next appointment.

BI-RADS CATEGORY  5: Highly suggestive of malignancy.

## 2021-09-26 IMAGING — MG MM DIGITAL DIAGNOSTIC UNILAT*L* W/ TOMO W/ CAD
6 series · 6 of 18 positions shown · non-contrast
Comparison: Previous exams including recent screening mammogram
dated [DATE].

CLINICAL DATA: Patient returns today to evaluate a LEFT breast mass
with associated calcifications identified on recent screening
mammogram.

EXAM:
DIGITAL DIAGNOSTIC UNILATERAL LEFT MAMMOGRAM WITH TOMOSYNTHESIS AND
CAD; ULTRASOUND LEFT BREAST LIMITED
TECHNIQUE: Left digital diagnostic mammography and breast tomosynthesis was
performed. The images were evaluated with computer-aided detection.;
Targeted ultrasound examination of the left breast was performed.

[L CC synth-2D]
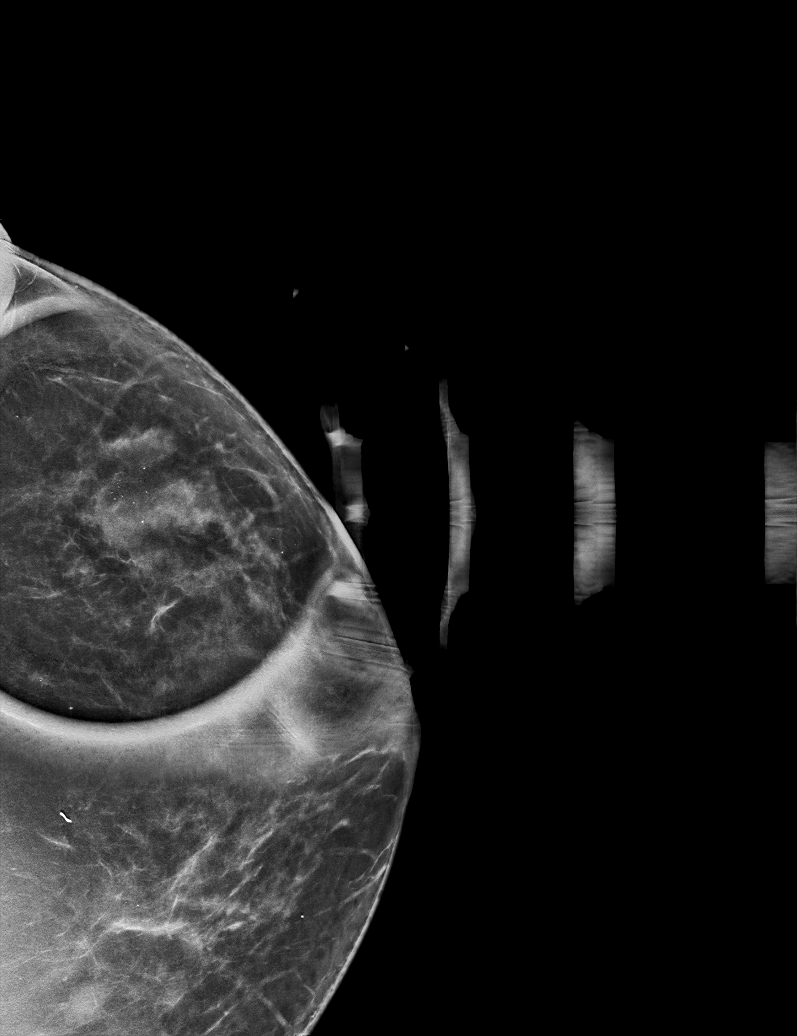

[L MLO synth-2D (1 of 2)]
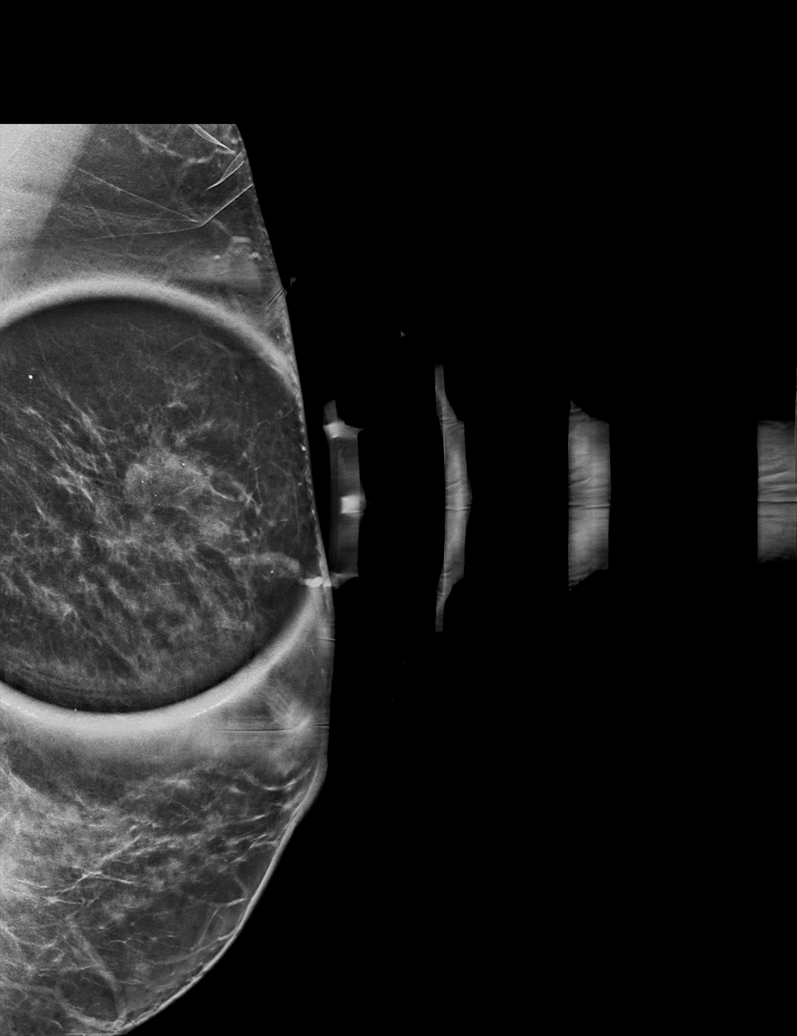

[L MLO synth-2D (2 of 2)]
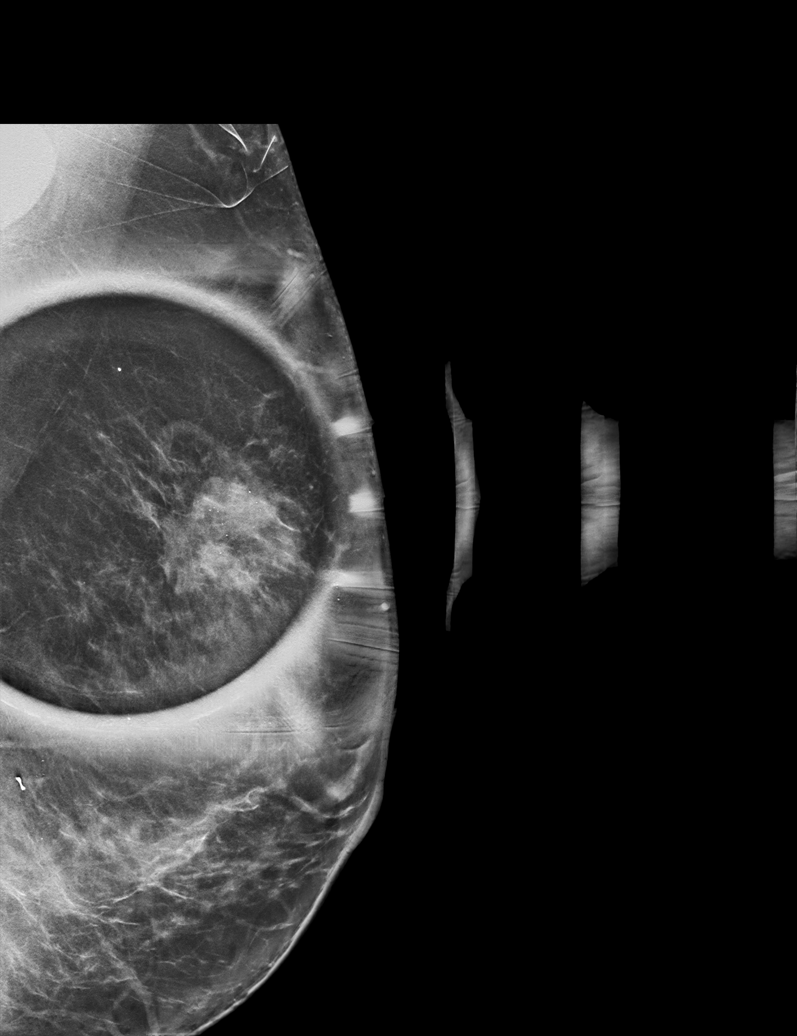

[L CC tomo · tomo slice 39/76.0]
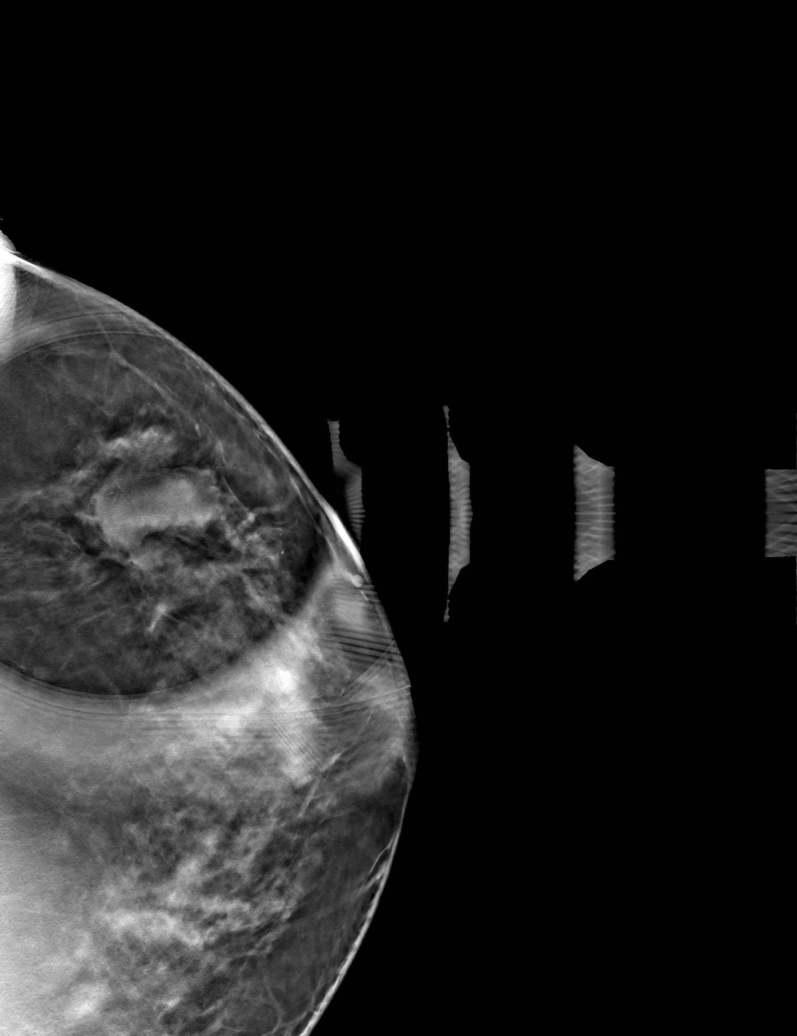

[L MLO tomo (1 of 2) · tomo slice 40/79.0]
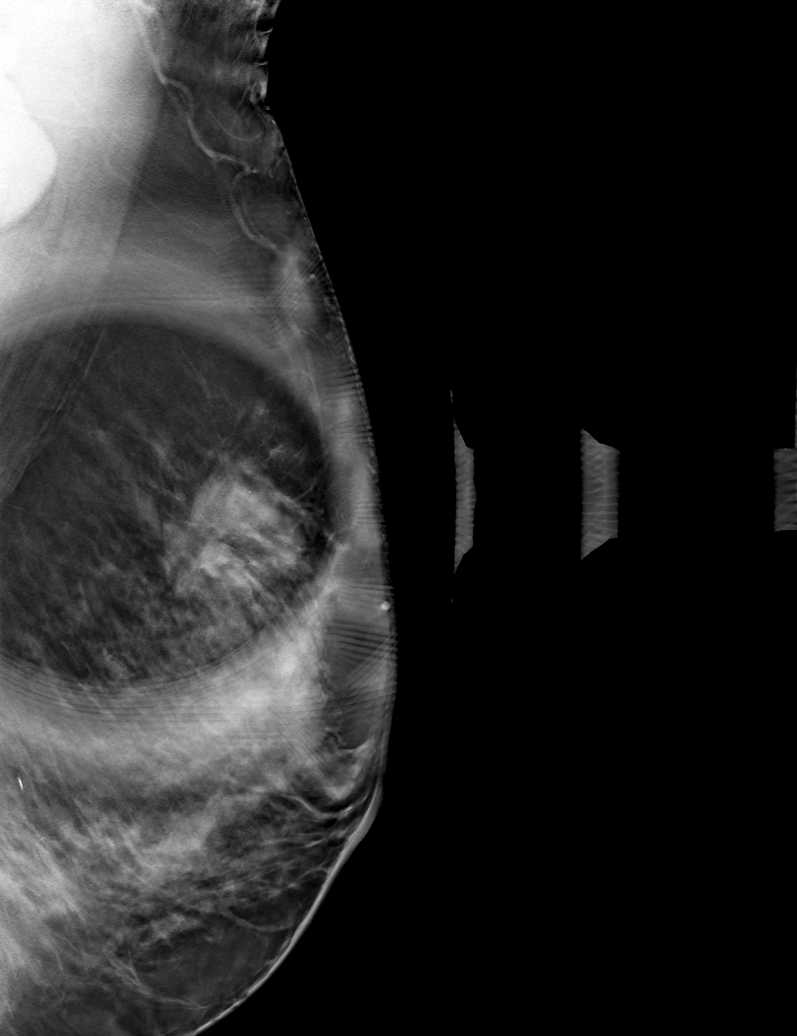

[L MLO tomo (2 of 2) · tomo slice 37/72.0]
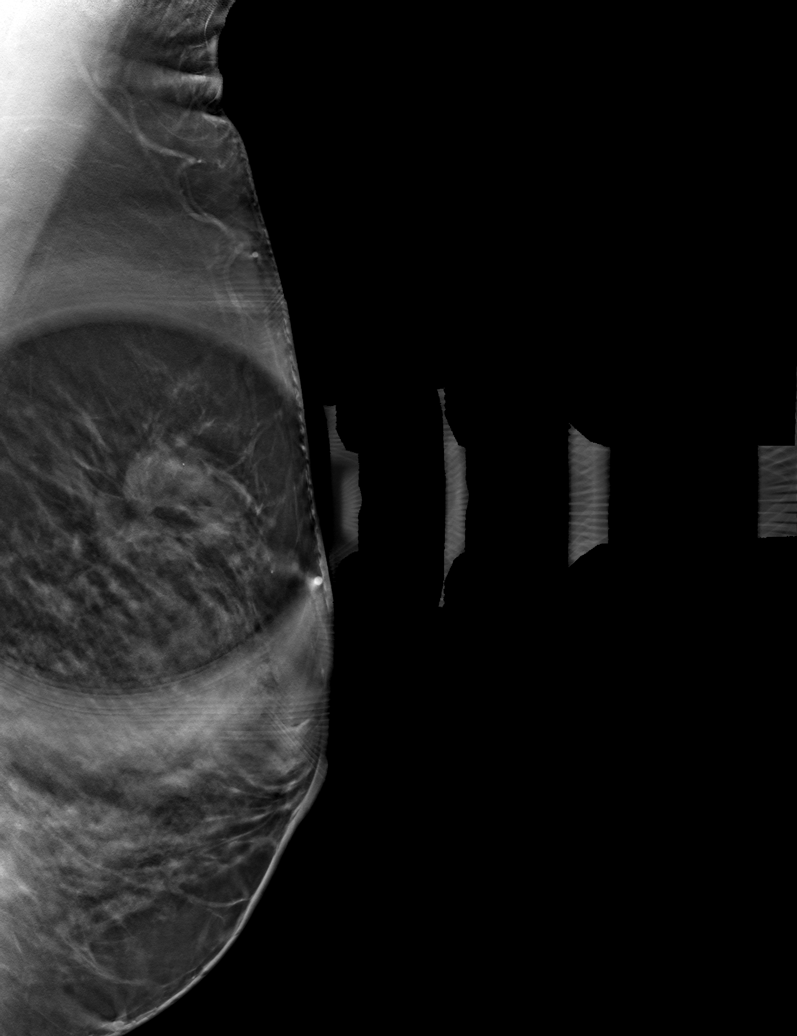

[6 of 18 positions shown; findings below may reference images not displayed]

ACR Breast Density Category b: There are scattered areas of
fibroglandular density.
FINDINGS: On today's additional diagnostic views, there is an irregular mass
confirmed within the upper-outer quadrant of the LEFT breast, with
associated microcalcifications, measuring approximately 2.6 cm
greatest dimension.

Targeted ultrasound is performed, showing an irregular hypoechoic
mass in the LEFT breast at the 3 o'clock axis, 6 cm from the nipple,
measuring 2.7 x 2.1 x 2.4 cm, corresponding to the mammographic
finding.

There are 2 enlarged lymph nodes identified within the LEFT axilla,
with most prominent lymph node demonstrating a cortex thickness of 5
mm.
IMPRESSION: 1. Highly suspicious mass in the LEFT breast at the 3 o'clock axis,
6 cm from the nipple, measuring 2.7 cm. Ultrasound-guided biopsy is
recommended.
2. Two enlarged lymph nodes within the LEFT axilla, most prominent
lymph node demonstrating a cortex thickness of 5 mm. This is
suspicious for metastatic lymphadenopathy. Ultrasound-guided biopsy
is recommended for 1 of these lymph nodes.

RECOMMENDATION:
1. Ultrasound-guided biopsy of the highly suspicious mass in the
LEFT breast at the 3 o'clock axis.
2. Ultrasound-guided biopsy of 1 of the enlarged lymph nodes within
the LEFT axilla.

Ultrasound-guided biopsies are scheduled on [REDACTED].

I have discussed the findings and recommendations with the patient.
If applicable, a reminder letter will be sent to the patient
regarding the next appointment.

BI-RADS CATEGORY  5: Highly suggestive of malignancy.

## 2021-09-28 ENCOUNTER — Ambulatory Visit
Admission: RE | Admit: 2021-09-28 | Discharge: 2021-09-28 | Disposition: A | Discharge status: Home / Self Care | Payer: BC Managed Care – PPO | Source: Ambulatory Visit | Attending: Family Medicine | Admitting: Family Medicine

## 2021-09-28 ENCOUNTER — Other Ambulatory Visit: Payer: Self-pay | Admitting: Family Medicine

## 2021-09-28 DIAGNOSIS — N6320 Unspecified lump in the left breast, unspecified quadrant: Secondary | ICD-10-CM

## 2021-09-28 DIAGNOSIS — R599 Enlarged lymph nodes, unspecified: Secondary | ICD-10-CM

## 2021-09-28 IMAGING — US US BREAST BX W LOC DEV 1ST LESION IMG BX SPEC US GUIDE*L*
1 series · 12 of 20 positions shown · non-contrast
Comparison: None Available.
COMPARISON: None Available.

Addendum:
CLINICAL DATA: 51-year-old female for tissue sampling of 2.7 cm
OUTER LEFT breast mass and an abnormal LEFT axillary lymph node.

EXAM:
ULTRASOUND GUIDED LEFT BREAST CORE NEEDLE BIOPSY
ULTRASOUND GUIDED LEFT AXILLARY LYMPH NODE CORE BIOPSY

[Series 1: us breast bx w loc dev 1st lesion img bx spec us g · 0.07mm/px · 12 of 20 slices shown]
[im 1/20]
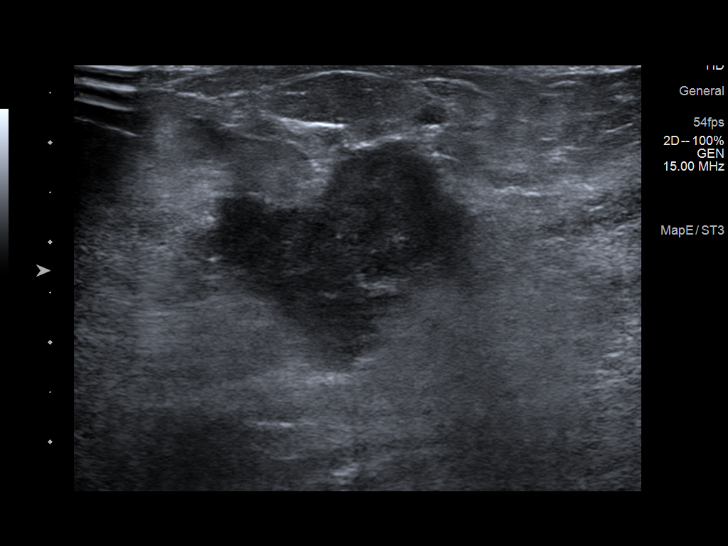
[im 3/20]
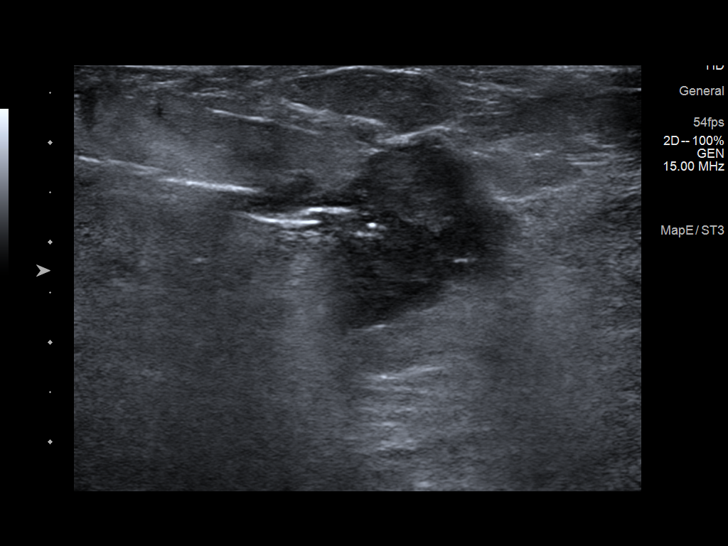
[im 5/20]
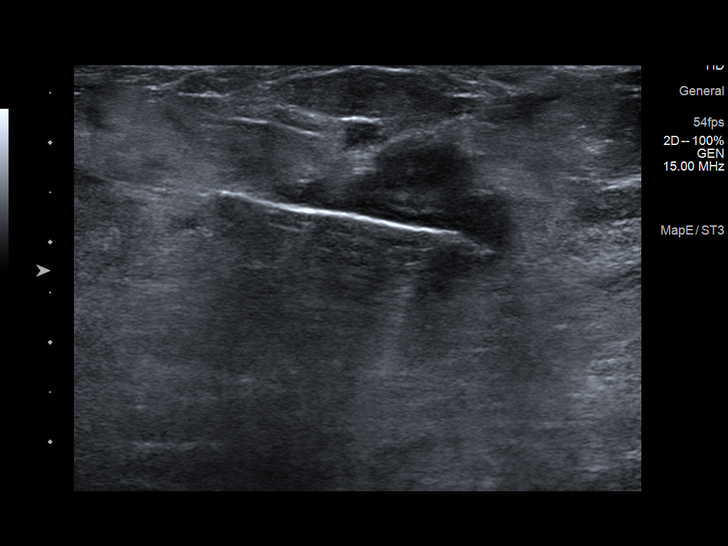
[im 6/20]
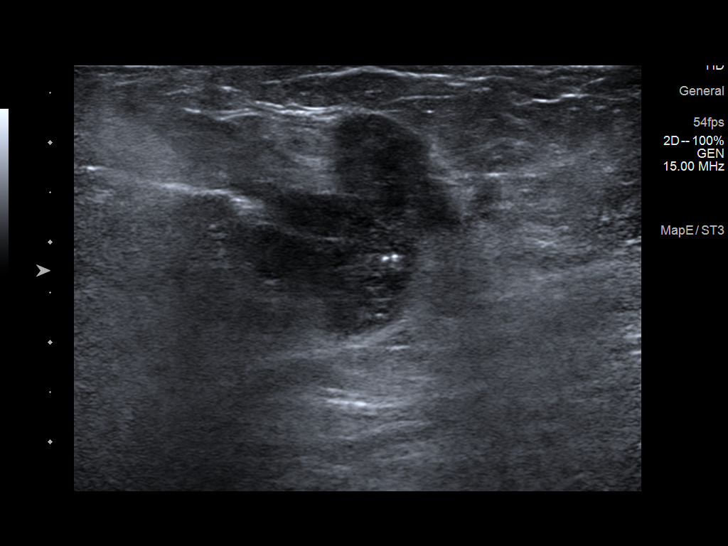
[im 8/20]
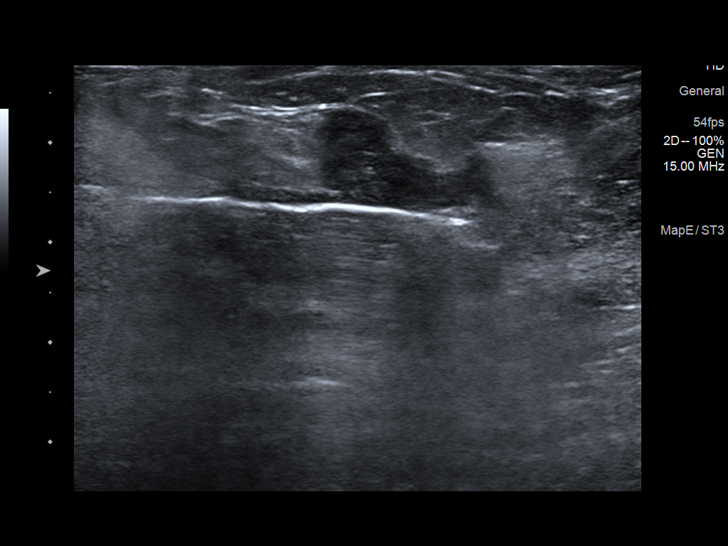
[im 10/20]
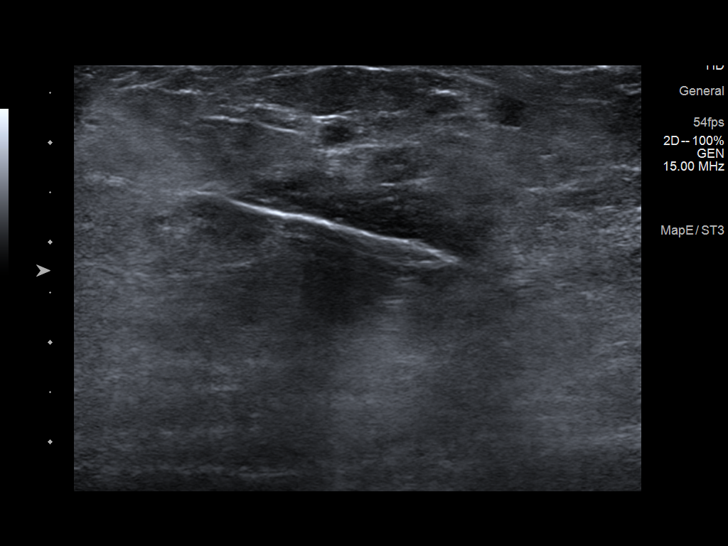
[im 11/20]
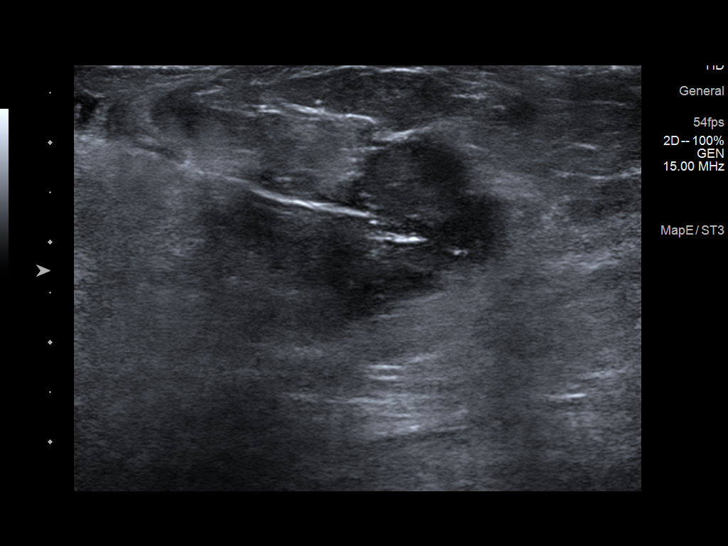
[im 13/20]
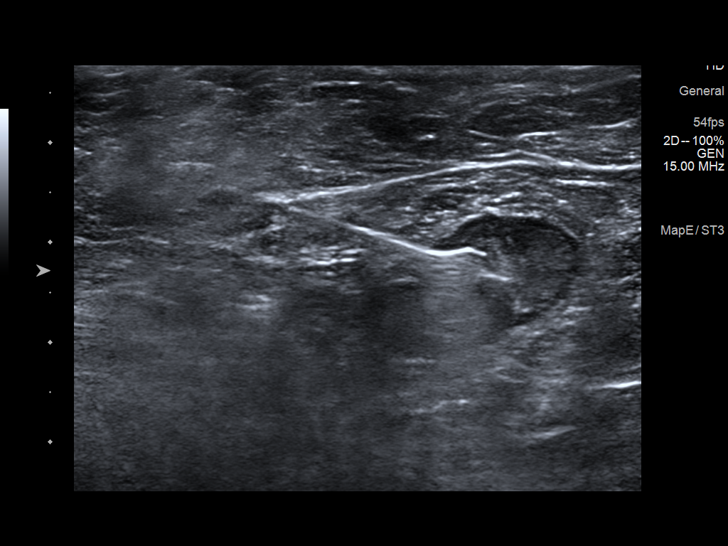
[im 15/20]
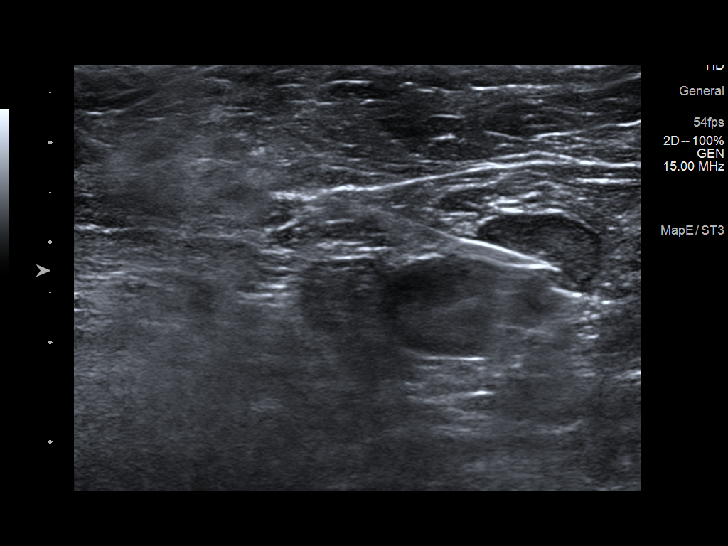
[im 16/20]
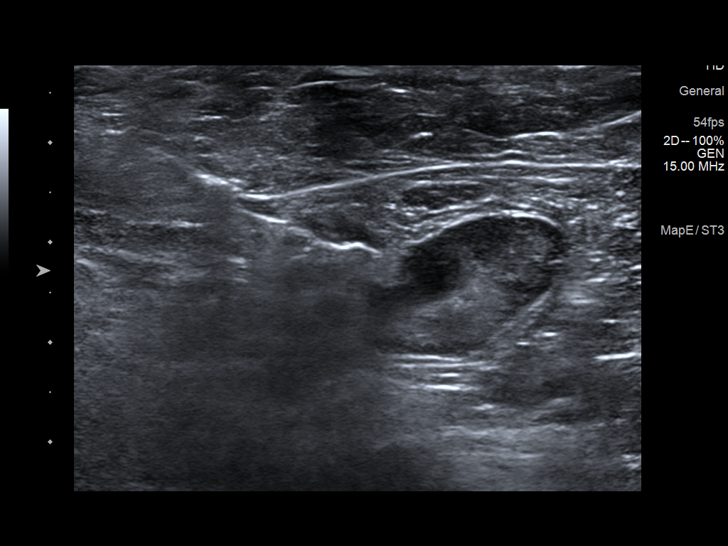
[im 18/20]
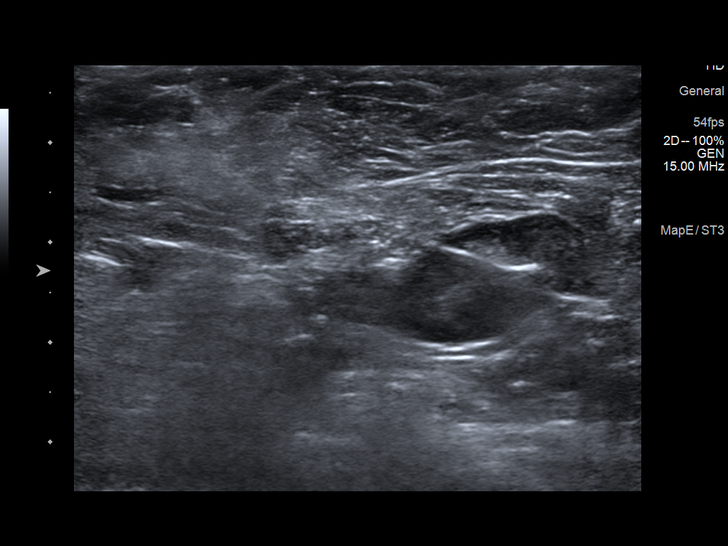
[im 20/20]
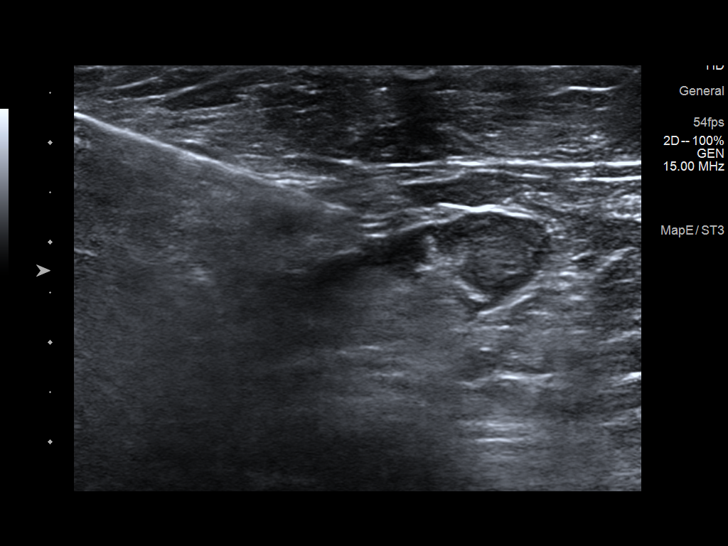

[12 of 20 positions shown; findings below may reference images not displayed]

PROCEDURE:
I met with the patient and we discussed the procedure of
ultrasound-guided biopsy, including benefits and alternatives. We
discussed the high likelihood of successful procedures. We discussed
the risks of the procedure, including infection, bleeding, tissue
injury, clip migration, and inadequate sampling. Informed written
consent was given. The usual time-out protocol was performed
immediately prior to the procedures.

ULTRASOUND GUIDED LEFT BREAST CORE NEEDLE BIOPSY

Using sterile technique and 1% Lidocaine as local anesthetic, under
direct ultrasound visualization, a 12 gauge HAZIMAH device was
used to perform biopsy of the 2.7 cm mass at the 3 o'clock position
of the LEFT breast using a superomedial approach. At the conclusion
of the procedure a RIBBON shaped tissue marker clip was deployed
into the biopsy cavity. Follow up 2 view mammogram was performed and
dictated separately.

ULTRASOUND GUIDED LEFT AXILLARY LYMPH NODE CORE BIOPSY

Using sterile technique and 1% Lidocaine as local anesthetic, under
direct ultrasound visualization, a 14 gauge HAZIMAH device was
used to perform biopsy of 1 of the abnormal LEFT axillary lymph
nodes using a MEDIAL approach. At the conclusion of the procedure a
spiral HydroMARK shaped tissue marker clip was deployed into the
biopsy cavity, with satisfactory placement confirmed
sonographically.
IMPRESSION: Ultrasound guided biopsy of 2.7 cm OUTER LEFT breast mass and
ultrasound-guided biopsy of an abnormal LEFT axillary lymph node. No
apparent complications.

ADDENDUM:
Pathology revealed GRADE III INVASIVE DUCTAL CARCINOMA of the LEFT
breast, 3:00 o'clock, (ribbon clip). This was found to be concordant
by Dr. HAZIMAH.

Pathology revealed BENIGN LYMPHOID TISSUE WITH FOLLICULAR AND
PARACORTICAL HYPERPLASIA of the LEFT axilla, (hydromark clip). Given
suspicious appearance of this and 1 other LEFT axillary lymph node,
node sampling/targeted node excision recommended at time of surgery.

Pathology results were discussed with the patient by telephone. The
patient reported doing well after the biopsies with tenderness at
the sites. Post biopsy instructions and care were reviewed and
questions were answered. The patient was encouraged to call The
direct phone number was provided.

HAZIMAH with Dr. HAZIMAH arrange a surgical
referral in [HOSPITAL][HOSPITAL], per patient request. Pathology report
was faxed to Dr. HAZIMAH on [DATE].

Pathology results reported by HAZIMAH, RN on [DATE].

*** End of Addendum ***
PROCEDURE:
I met with the patient and we discussed the procedure of
ultrasound-guided biopsy, including benefits and alternatives. We
discussed the high likelihood of successful procedures. We discussed
the risks of the procedure, including infection, bleeding, tissue
injury, clip migration, and inadequate sampling. Informed written
consent was given. The usual time-out protocol was performed
immediately prior to the procedures.

ULTRASOUND GUIDED LEFT BREAST CORE NEEDLE BIOPSY

Using sterile technique and 1% Lidocaine as local anesthetic, under
direct ultrasound visualization, a 12 gauge HAZIMAH device was
used to perform biopsy of the 2.7 cm mass at the 3 o'clock position
of the LEFT breast using a superomedial approach. At the conclusion
of the procedure a RIBBON shaped tissue marker clip was deployed
into the biopsy cavity. Follow up 2 view mammogram was performed and
dictated separately.

ULTRASOUND GUIDED LEFT AXILLARY LYMPH NODE CORE BIOPSY

Using sterile technique and 1% Lidocaine as local anesthetic, under
direct ultrasound visualization, a 14 gauge HAZIMAH device was
used to perform biopsy of 1 of the abnormal LEFT axillary lymph
nodes using a MEDIAL approach. At the conclusion of the procedure a
spiral HydroMARK shaped tissue marker clip was deployed into the
biopsy cavity, with satisfactory placement confirmed
sonographically.
IMPRESSION: Ultrasound guided biopsy of 2.7 cm OUTER LEFT breast mass and
ultrasound-guided biopsy of an abnormal LEFT axillary lymph node. No
apparent complications.

## 2021-09-28 IMAGING — MG MM BREAST LOCALIZATION CLIP
6 series · 6 of 18 positions shown · non-contrast
Comparison: Previous exam(s).

CLINICAL DATA: Evaluate placement of RIBBON biopsy clip and spiral
HydroMARK biopsy clip following ultrasound-guided LEFT breast
biopsies.

EXAM:
3D DIAGNOSTIC LEFT MAMMOGRAM POST ULTRASOUND BIOPSY

[L ML synth-2D]
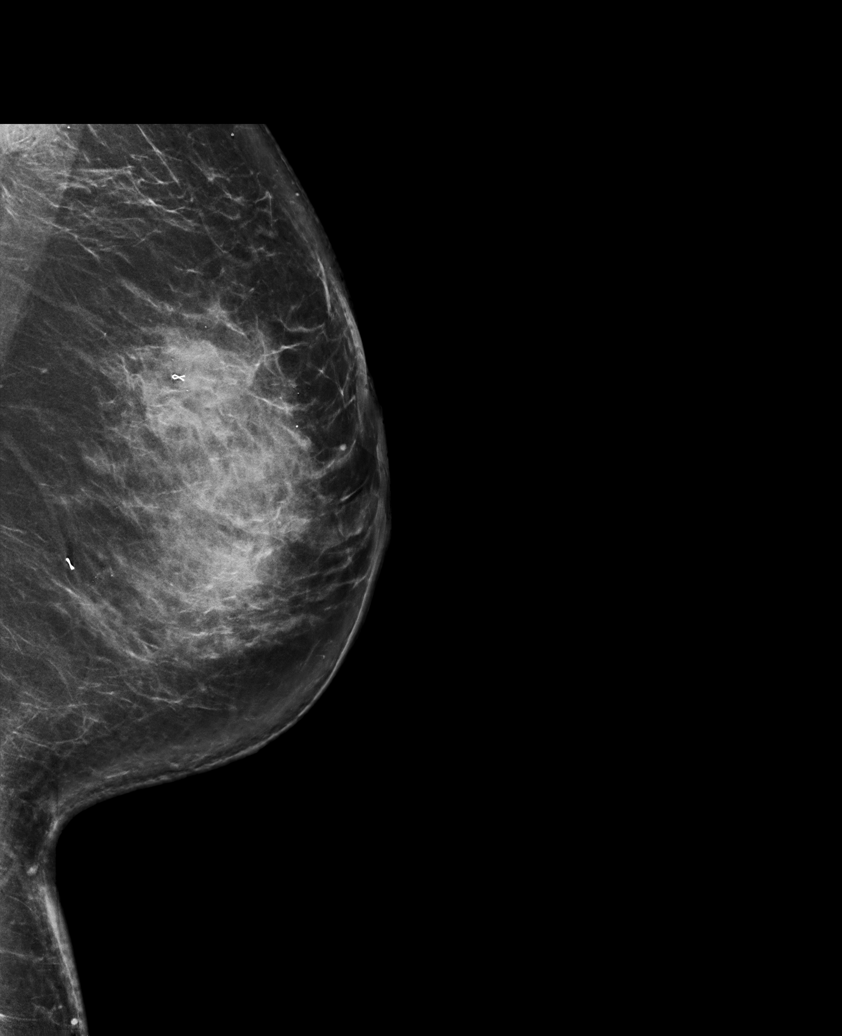

[L MLO synth-2D]
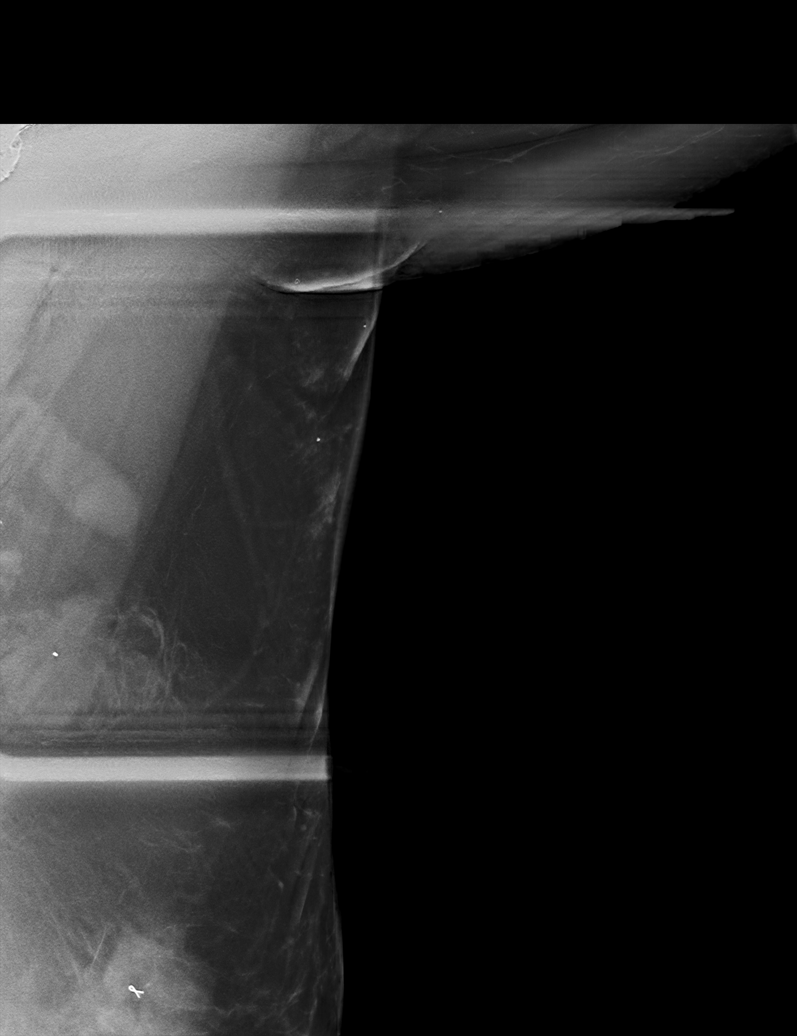

[L CC synth-2D]
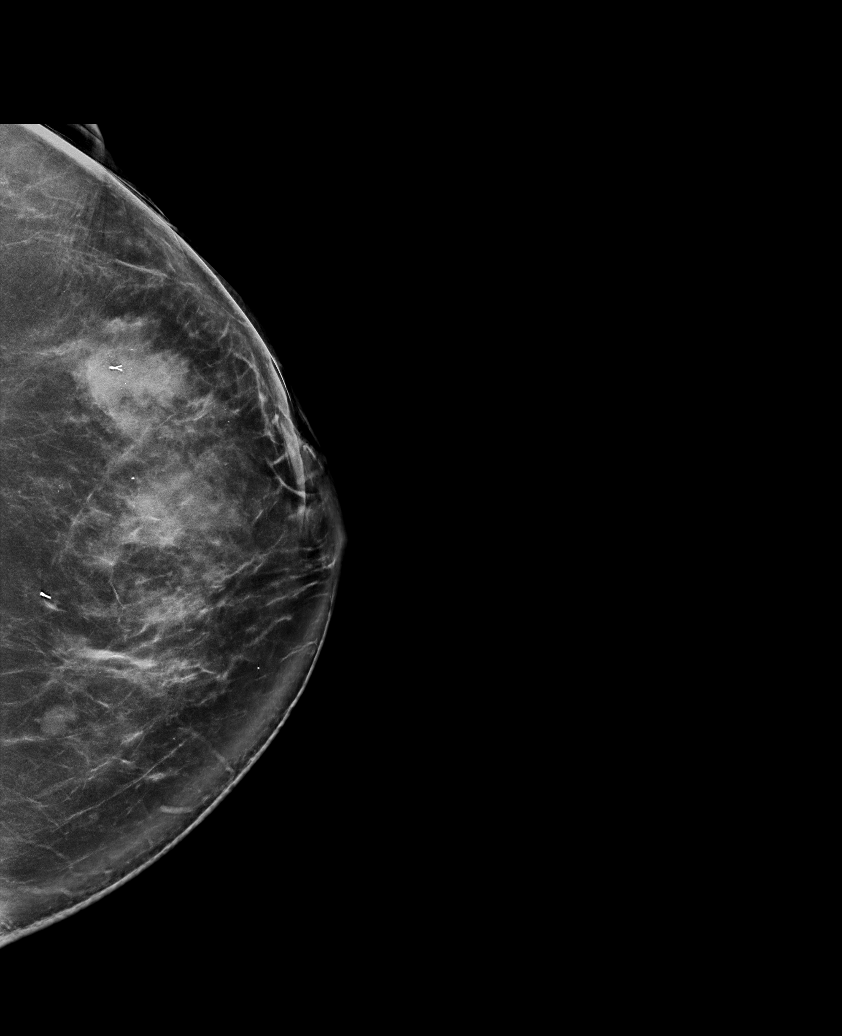

[L MLO tomo · tomo slice 65/130.0]
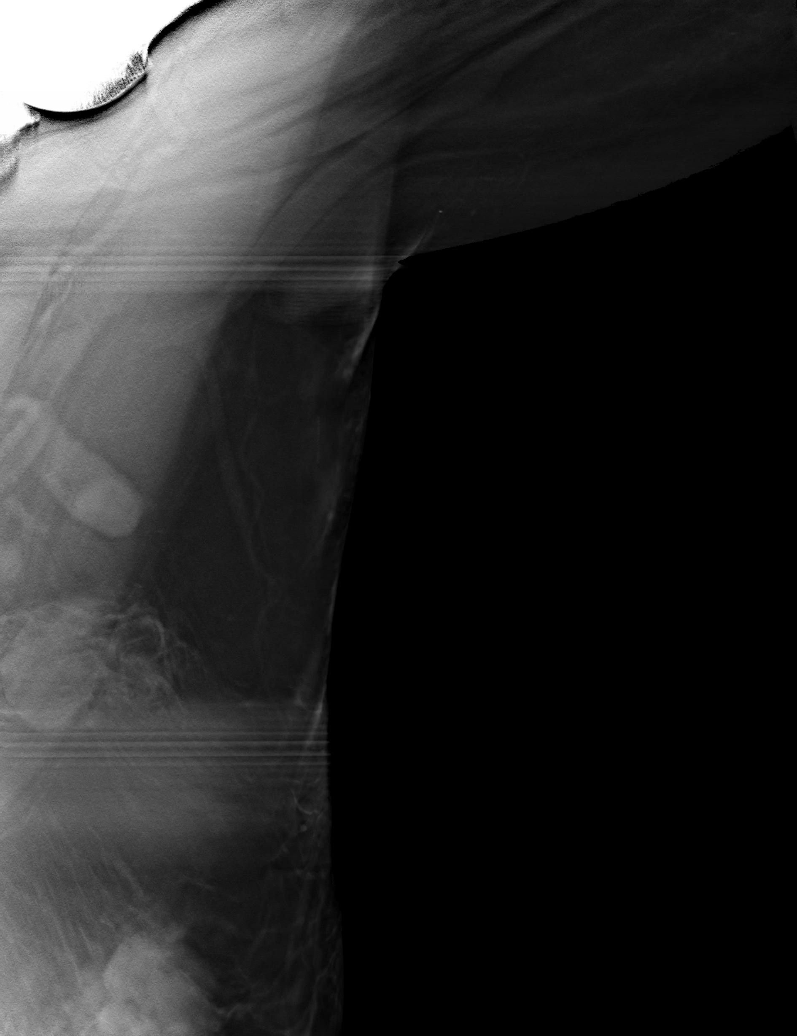

[L ML tomo · tomo slice 51/100.0]
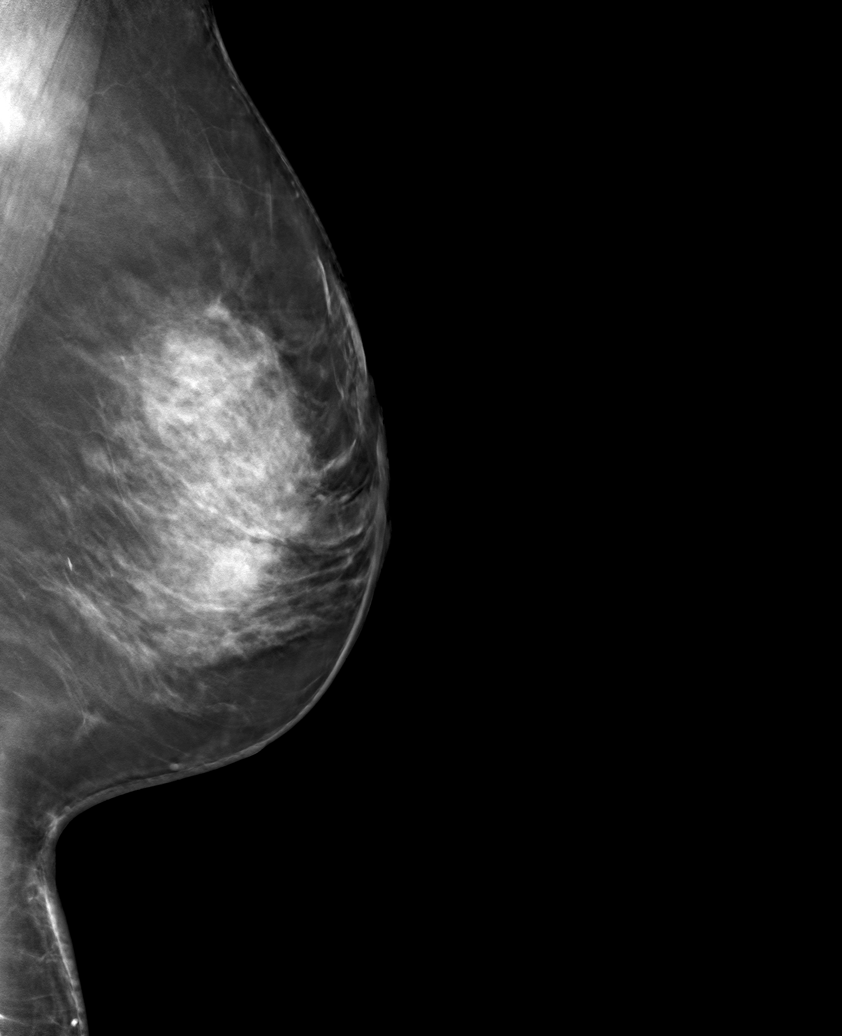

[L CC tomo · tomo slice 52/103.0]
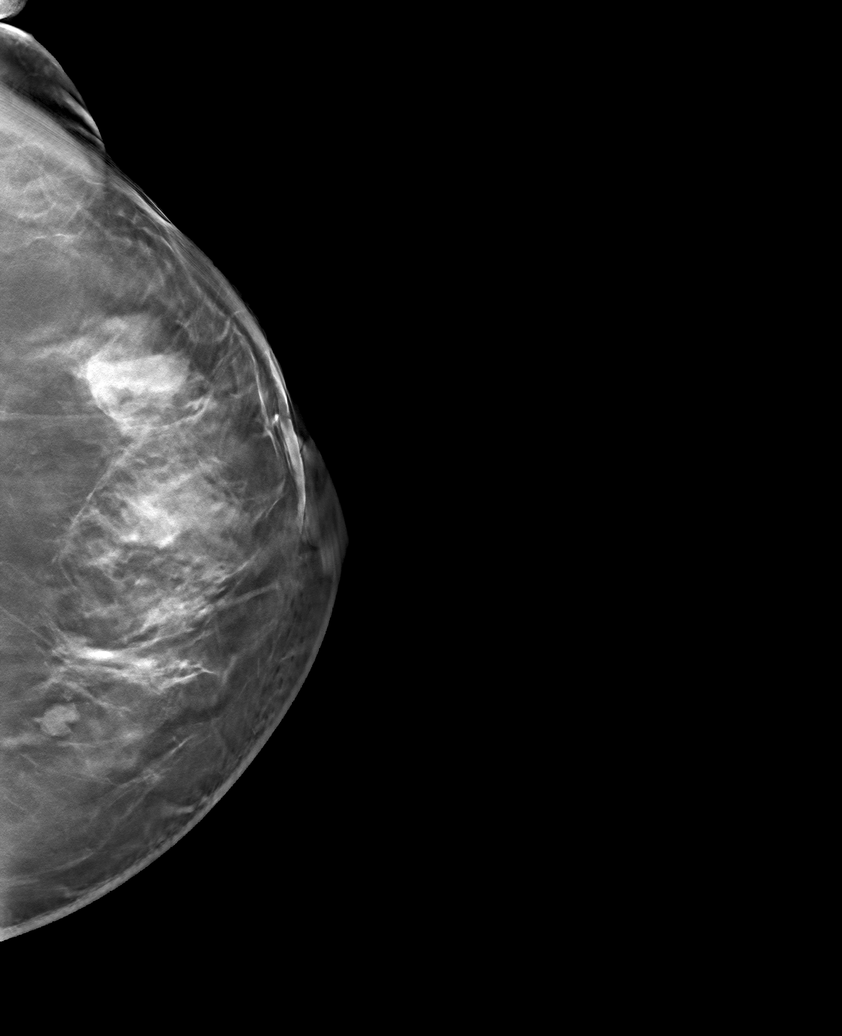

[6 of 18 positions shown; findings below may reference images not displayed]

FINDINGS: 3D Mammographic images were obtained following ultrasound guided
biopsy of a 2.7 cm OUTER LEFT breast mass (RIBBON clip) and an
abnormal LEFT axillary lymph node (spiral HydroMARK clip).

The RIBBON biopsy marking clip is in expected position at the site
of biopsy in the UPPER OUTER LEFT breast mass.

The spiral HydroMARK clip is in satisfactory position within the
biopsied LEFT axillary lymph node
IMPRESSION: Appropriate positioning of the RIBBON shaped biopsy marking clip at
the site of biopsy in the UPPER OUTER LEFT breast.

Appropriate positioning of the spiral HydroMARK biopsy marking clip
at the site of biopsy in the LEFT axilla.

Final Assessment: Post Procedure Mammograms for Marker Placement
